# Patient Record
Sex: Female | Born: 1985 | Race: Asian | Hispanic: No | Marital: Single | Smoking: Never smoker
Health system: Southern US, Community
[De-identification: ages and names within clinical notes are randomized; demographics above are authoritative.]

## PROBLEM LIST (undated history)

## (undated) ENCOUNTER — Inpatient Hospital Stay (HOSPITAL_COMMUNITY): Payer: Self-pay

## (undated) DIAGNOSIS — R87629 Unspecified abnormal cytological findings in specimens from vagina: Secondary | ICD-10-CM

## (undated) HISTORY — PX: NO PAST SURGERIES: SHX2092

---

## 2006-11-22 ENCOUNTER — Ambulatory Visit (HOSPITAL_COMMUNITY): Admission: RE | Admit: 2006-11-22 | Discharge: 2006-11-22 | Payer: Self-pay | Admitting: Obstetrics & Gynecology

## 2007-03-01 ENCOUNTER — Inpatient Hospital Stay (HOSPITAL_COMMUNITY): Admission: AD | Admit: 2007-03-01 | Discharge: 2007-03-03 | Payer: Self-pay | Admitting: Obstetrics & Gynecology

## 2007-03-01 ENCOUNTER — Ambulatory Visit: Payer: Self-pay | Admitting: Family Medicine

## 2008-01-25 ENCOUNTER — Encounter (INDEPENDENT_AMBULATORY_CARE_PROVIDER_SITE_OTHER): Payer: Self-pay | Admitting: Obstetrics and Gynecology

## 2008-01-25 ENCOUNTER — Inpatient Hospital Stay (HOSPITAL_COMMUNITY): Admission: AD | Admit: 2008-01-25 | Discharge: 2008-01-27 | Payer: Self-pay | Admitting: Family Medicine

## 2008-07-08 IMAGING — US US OB COMP +14 WK
1 series · 1 of 1 positions shown · non-contrast
Comparison: none

OBSTETRICAL ULTRASOUND:

 This ultrasound exam was performed in the [HOSPITAL] Ultrasound Department.  The OB US report was generated in the AS system, and faxed to the ordering physician.  This report is also available in [REDACTED] PACS.

[Series 1: us ob comp +14 wk · 0.17mm/px · 1 of 1 slices shown]
[im 1/1]
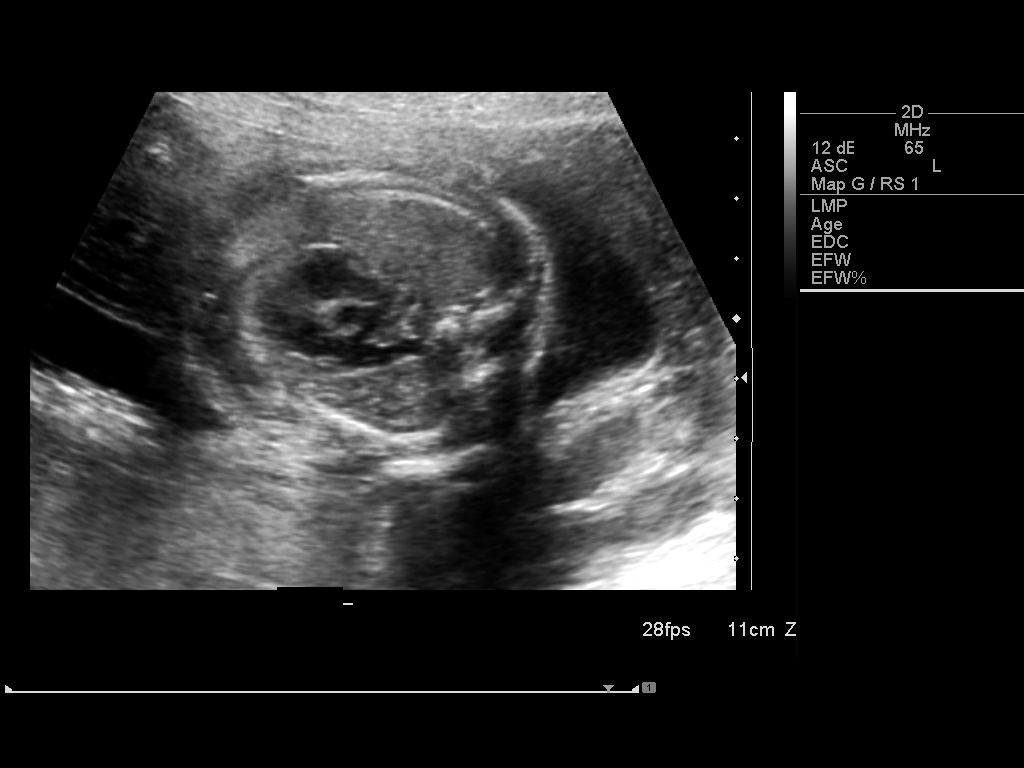

[1 of 1 positions shown; findings below may reference images not displayed]

IMPRESSION: See AS Obstetric US report.

## 2010-06-10 ENCOUNTER — Emergency Department (HOSPITAL_COMMUNITY): Admission: EM | Admit: 2010-06-10 | Discharge: 2010-06-11 | Payer: Self-pay | Admitting: Emergency Medicine

## 2010-12-30 LAB — POCT I-STAT, CHEM 8
BUN: 10 mg/dL (ref 6–23)
Calcium, Ion: 1.12 mmol/L (ref 1.12–1.32)
Chloride: 103 mEq/L (ref 96–112)
HCT: 44 % (ref 36.0–46.0)
Hemoglobin: 15 g/dL (ref 12.0–15.0)
Potassium: 3.6 mEq/L (ref 3.5–5.1)
Sodium: 139 mEq/L (ref 135–145)
TCO2: 26 mmol/L (ref 0–100)

## 2010-12-30 LAB — DIFFERENTIAL
Basophils Absolute: 0 10*3/uL (ref 0.0–0.1)
Basophils Relative: 0 % (ref 0–1)
Eosinophils Absolute: 0.1 10*3/uL (ref 0.0–0.7)
Eosinophils Relative: 1 % (ref 0–5)
Lymphocytes Relative: 13 % (ref 12–46)
Neutro Abs: 7.9 10*3/uL — ABNORMAL HIGH (ref 1.7–7.7)
Neutrophils Relative %: 82 % — ABNORMAL HIGH (ref 43–77)

## 2010-12-30 LAB — URINALYSIS, ROUTINE W REFLEX MICROSCOPIC
Bilirubin Urine: NEGATIVE
Hgb urine dipstick: NEGATIVE
Ketones, ur: NEGATIVE mg/dL
Specific Gravity, Urine: 1.023 (ref 1.005–1.030)

## 2010-12-30 LAB — URINE MICROSCOPIC-ADD ON

## 2010-12-30 LAB — CBC
HCT: 39.5 % (ref 36.0–46.0)
Hemoglobin: 13.4 g/dL (ref 12.0–15.0)
MCH: 26.2 pg (ref 26.0–34.0)
MCV: 77.3 fL — ABNORMAL LOW (ref 78.0–100.0)
RBC: 5.11 MIL/uL (ref 3.87–5.11)
RDW: 13.3 % (ref 11.5–15.5)

## 2011-07-11 LAB — CBC
HCT: 31.5 — ABNORMAL LOW
Hemoglobin: 11 — ABNORMAL LOW
Platelets: 148 — ABNORMAL LOW
RBC: 4.19
RBC: 4.47
RDW: 17.5 — ABNORMAL HIGH
WBC: 11.7 — ABNORMAL HIGH
WBC: 8.6

## 2013-07-08 LAB — OB RESULTS CONSOLE ABO/RH: RH TYPE: POSITIVE

## 2013-07-08 LAB — OB RESULTS CONSOLE RUBELLA ANTIBODY, IGM: Rubella: IMMUNE

## 2013-07-08 LAB — OB RESULTS CONSOLE RPR
RPR: NONREACTIVE
RPR: NONREACTIVE
RPR: NONREACTIVE
RPR: NONREACTIVE

## 2013-07-08 LAB — OB RESULTS CONSOLE HEPATITIS B SURFACE ANTIGEN: HEP B S AG: NEGATIVE

## 2013-07-08 LAB — OB RESULTS CONSOLE ANTIBODY SCREEN: Antibody Screen: NEGATIVE

## 2013-07-08 LAB — OB RESULTS CONSOLE HIV ANTIBODY (ROUTINE TESTING): HIV: NONREACTIVE

## 2013-10-16 NOTE — L&D Delivery Note (Signed)
Delivery Note At 7:57 AM a viable female was delivered via Vaginal, Spontaneous Delivery OA Presentation Apgars 9 9 weight pending  Placenta status:spontaneously with 3 vessel cord and intact , .  Cord:  with the following complications:none .  Cord pH: pending  Anesthesia: None  Episiotomy: none Lacerations: none Suture Repair: not applicable Est. Blood Loss (mL): 300  Mom to postpartum.  Baby to Couplet care / Skin to Skin.  Jeani HawkingMichelle L Thurma Priego 01/27/2014, 8:10 AM

## 2014-01-13 LAB — OB RESULTS CONSOLE GBS: GBS: POSITIVE

## 2014-01-23 ENCOUNTER — Encounter (HOSPITAL_COMMUNITY): Payer: Self-pay | Admitting: *Deleted

## 2014-01-23 ENCOUNTER — Inpatient Hospital Stay (HOSPITAL_COMMUNITY): Admission: RE | Admit: 2014-01-23 | Payer: Self-pay | Source: Ambulatory Visit

## 2014-01-27 ENCOUNTER — Inpatient Hospital Stay (HOSPITAL_COMMUNITY)
Admission: AD | Admit: 2014-01-27 | Discharge: 2014-01-29 | DRG: 775 | Disposition: A | Discharge status: Home / Self Care | Payer: Medicaid Other | Source: Ambulatory Visit | Attending: Obstetrics and Gynecology | Admitting: Obstetrics and Gynecology

## 2014-01-27 ENCOUNTER — Encounter (HOSPITAL_COMMUNITY): Payer: Self-pay

## 2014-01-27 DIAGNOSIS — Z22330 Carrier of Group B streptococcus: Secondary | ICD-10-CM

## 2014-01-27 DIAGNOSIS — O99892 Other specified diseases and conditions complicating childbirth: Principal | ICD-10-CM | POA: Diagnosis present

## 2014-01-27 DIAGNOSIS — O9989 Other specified diseases and conditions complicating pregnancy, childbirth and the puerperium: Principal | ICD-10-CM

## 2014-01-27 HISTORY — DX: Unspecified abnormal cytological findings in specimens from vagina: R87.629

## 2014-01-27 LAB — ABO/RH: ABO/RH(D): A POS

## 2014-01-27 LAB — TYPE AND SCREEN
ABO/RH(D): A POS
Antibody Screen: NEGATIVE

## 2014-01-27 LAB — CBC
HCT: 34.9 % — ABNORMAL LOW (ref 36.0–46.0)
Hemoglobin: 11.5 g/dL — ABNORMAL LOW (ref 12.0–15.0)
MCH: 24.1 pg — ABNORMAL LOW (ref 26.0–34.0)
MCHC: 33 g/dL (ref 30.0–36.0)
MCV: 73 fL — ABNORMAL LOW (ref 78.0–100.0)
Platelets: 150 K/uL (ref 150–400)
RBC: 4.78 MIL/uL (ref 3.87–5.11)
RDW: 15.7 % — ABNORMAL HIGH (ref 11.5–15.5)
WBC: 10.6 K/uL — ABNORMAL HIGH (ref 4.0–10.5)

## 2014-01-27 LAB — SYPHILIS: RPR W/REFLEX TO RPR TITER AND TREPONEMAL ANTIBODIES, TRADITIONAL SCREENING AND DIAGNOSIS ALGORITHM

## 2014-01-27 MED ORDER — MEDROXYPROGESTERONE ACETATE 150 MG/ML IM SUSP: 150.0000 mg | INTRAMUSCULAR | Status: DC | PRN

## 2014-01-27 MED ORDER — LACTATED RINGERS IV SOLN
INTRAVENOUS | Status: DC
  Administered 2014-01-27: 02:00:00 via INTRAVENOUS

## 2014-01-27 MED ORDER — ONDANSETRON HCL 4 MG/2ML IJ SOLN: 4.0000 mg | Freq: Four times a day (QID) | INTRAMUSCULAR | Status: DC | PRN

## 2014-01-27 MED ORDER — PRENATAL MULTIVITAMIN CH
1.0000 | ORAL_TABLET | Freq: Every day | ORAL | Status: DC
  Administered 2014-01-27 – 2014-01-28 (×2): 1 via ORAL
  Filled 2014-01-27 (×2): qty 1

## 2014-01-27 MED ORDER — OXYTOCIN 40 UNITS IN LACTATED RINGERS INFUSION - SIMPLE MED
62.5000 mL/h | INTRAVENOUS | Status: DC
  Filled 2014-01-27: qty 1000

## 2014-01-27 MED ORDER — FLEET ENEMA 7-19 GM/118ML RE ENEM: 1.0000 | ENEMA | Freq: Every day | RECTAL | Status: DC | PRN

## 2014-01-27 MED ORDER — LIDOCAINE HCL (PF) 1 % IJ SOLN
30.0000 mL | INTRAMUSCULAR | Status: DC | PRN
  Filled 2014-01-27: qty 30

## 2014-01-27 MED ORDER — BISACODYL 10 MG RE SUPP: 10.0000 mg | Freq: Every day | RECTAL | Status: DC | PRN

## 2014-01-27 MED ORDER — DIBUCAINE 1 % RE OINT: 1.0000 "application " | TOPICAL_OINTMENT | RECTAL | Status: DC | PRN

## 2014-01-27 MED ORDER — IBUPROFEN 600 MG PO TABS
600.0000 mg | ORAL_TABLET | Freq: Four times a day (QID) | ORAL | Status: DC
  Administered 2014-01-27 – 2014-01-29 (×8): 600 mg via ORAL
  Filled 2014-01-27 (×8): qty 1

## 2014-01-27 MED ORDER — OXYCODONE-ACETAMINOPHEN 5-325 MG PO TABS
1.0000 | ORAL_TABLET | ORAL | Status: DC | PRN
  Administered 2014-01-27: 1 via ORAL
  Filled 2014-01-27: qty 1

## 2014-01-27 MED ORDER — IBUPROFEN 600 MG PO TABS: 600.0000 mg | ORAL_TABLET | Freq: Four times a day (QID) | ORAL | Status: DC | PRN

## 2014-01-27 MED ORDER — ZOLPIDEM TARTRATE 5 MG PO TABS: 5.0000 mg | ORAL_TABLET | Freq: Every evening | ORAL | Status: DC | PRN

## 2014-01-27 MED ORDER — SODIUM CHLORIDE 0.9 % IV SOLN
2.0000 g | Freq: Once | INTRAVENOUS | Status: AC
  Administered 2014-01-27: 2 g via INTRAVENOUS
  Filled 2014-01-27: qty 2000

## 2014-01-27 MED ORDER — BENZOCAINE-MENTHOL 20-0.5 % EX AERO
1.0000 "application " | INHALATION_SPRAY | CUTANEOUS | Status: DC | PRN
  Administered 2014-01-27: 1 via TOPICAL
  Filled 2014-01-27: qty 56

## 2014-01-27 MED ORDER — FLEET ENEMA 7-19 GM/118ML RE ENEM: 1.0000 | ENEMA | RECTAL | Status: DC | PRN

## 2014-01-27 MED ORDER — ONDANSETRON HCL 4 MG/2ML IJ SOLN
4.0000 mg | INTRAMUSCULAR | Status: DC | PRN
Start: 2014-01-27 — End: 2014-01-29

## 2014-01-27 MED ORDER — SIMETHICONE 80 MG PO CHEW: 80.0000 mg | CHEWABLE_TABLET | ORAL | Status: DC | PRN

## 2014-01-27 MED ORDER — LANOLIN HYDROUS EX OINT: TOPICAL_OINTMENT | CUTANEOUS | Status: DC | PRN

## 2014-01-27 MED ORDER — MEASLES, MUMPS & RUBELLA VAC ~~LOC~~ INJ
0.5000 mL | INJECTION | Freq: Once | SUBCUTANEOUS | Status: DC
  Filled 2014-01-27: qty 0.5

## 2014-01-27 MED ORDER — LACTATED RINGERS IV SOLN: 500.0000 mL | INTRAVENOUS | Status: DC | PRN

## 2014-01-27 MED ORDER — CITRIC ACID-SODIUM CITRATE 334-500 MG/5ML PO SOLN: 30.0000 mL | ORAL | Status: DC | PRN

## 2014-01-27 MED ORDER — DIPHENHYDRAMINE HCL 25 MG PO CAPS: 25.0000 mg | ORAL_CAPSULE | Freq: Four times a day (QID) | ORAL | Status: DC | PRN

## 2014-01-27 MED ORDER — ONDANSETRON HCL 4 MG PO TABS: 4.0000 mg | ORAL_TABLET | ORAL | Status: DC | PRN

## 2014-01-27 MED ORDER — ACETAMINOPHEN 325 MG PO TABS: 650.0000 mg | ORAL_TABLET | ORAL | Status: DC | PRN

## 2014-01-27 MED ORDER — TETANUS-DIPHTH-ACELL PERTUSSIS 5-2.5-18.5 LF-MCG/0.5 IM SUSP: 0.5000 mL | Freq: Once | INTRAMUSCULAR | Status: DC

## 2014-01-27 MED ORDER — OXYTOCIN BOLUS FROM INFUSION: 500.0000 mL | INTRAVENOUS | Status: DC

## 2014-01-27 MED ORDER — SENNOSIDES-DOCUSATE SODIUM 8.6-50 MG PO TABS
2.0000 | ORAL_TABLET | ORAL | Status: DC
  Administered 2014-01-28 – 2014-01-29 (×2): 2 via ORAL
  Filled 2014-01-27 (×2): qty 2

## 2014-01-27 MED ORDER — BUTORPHANOL TARTRATE 1 MG/ML IJ SOLN
1.0000 mg | Freq: Once | INTRAMUSCULAR | Status: AC
  Administered 2014-01-27: 1 mg via INTRAVENOUS
  Filled 2014-01-27: qty 1

## 2014-01-27 MED ORDER — SODIUM CHLORIDE 0.9 % IV SOLN
1.0000 g | INTRAVENOUS | Status: DC
  Administered 2014-01-27: 1 g via INTRAVENOUS
  Filled 2014-01-27 (×3): qty 1000

## 2014-01-27 MED ORDER — WITCH HAZEL-GLYCERIN EX PADS: 1.0000 "application " | MEDICATED_PAD | CUTANEOUS | Status: DC | PRN

## 2014-01-27 MED ORDER — OXYCODONE-ACETAMINOPHEN 5-325 MG PO TABS: 1.0000 | ORAL_TABLET | ORAL | Status: DC | PRN

## 2014-01-27 NOTE — H&P (Signed)
Wendy Alexander is a 28 y.o. female presenting for labor. Maternal Medical History:  Reason for admission: Contractions.   Contractions: Frequency: regular.   Perceived severity is moderate.    Fetal activity: Perceived fetal activity is normal.    Prenatal complications: no prenatal complications   OB History   Grav Para Term Preterm Abortions TAB SAB Ect Mult Living   3 2 2       2      Past Medical History  Diagnosis Date  . Vaginal Pap smear, abnormal     last pap 03/21/13 WNL   Past Surgical History  Procedure Laterality Date  . No past surgeries     Family History: family history is not on file. Social History:  reports that she has never smoked. She does not have any smokeless tobacco history on file. She reports that she does not drink alcohol or use illicit drugs.   Prenatal Transfer Tool  Maternal Diabetes: No Genetic Screening: Normal Maternal Ultrasounds/Referrals: Normal Fetal Ultrasounds or other Referrals:  None Maternal Substance Abuse:  No Significant Maternal Medications:  None Significant Maternal Lab Results:  None Other Comments:  None  Review of Systems  All other systems reviewed and are negative.   Dilation: Lip/rim Effacement (%): 100 Station: +1 Exam by:: Dr. Vincente PoliGrewal Blood pressure 107/52, pulse 68, temperature 97.8 F (36.6 C), temperature source Oral, resp. rate 18, height 5\' 3"  (1.6 m), weight 74.844 kg (165 lb), last menstrual period 04/24/2013. Maternal Exam:  Uterine Assessment: Contraction strength is moderate.  Contraction frequency is regular.   Abdomen: Fetal presentation: vertex     Fetal Exam Fetal State Assessment: Category I - tracings are normal.     Physical Exam  Nursing note and vitals reviewed. Constitutional: She appears well-developed.  HENT:  Head: Normocephalic.  Eyes: Pupils are equal, round, and reactive to light.  Neck: Normal range of motion.  Respiratory: Effort normal.  GI: Soft.    Prenatal  labs: ABO, Rh: --/--/A POS, A POS (04/14 0139) Antibody: NEG (04/14 0139) Rubella: Immune (09/23 0000) RPR: Nonreactive, Nonreactive, Nonreactive, Nonreactive (09/23 0000)  HBsAg: Negative (09/23 0000)  HIV: Non-reactive (09/23 0000)  GBS: Positive (03/31 0000)   Assessment/Plan: IUP at term Labor Anticipate nsvd  [name] 01/27/2014, 8:07 AM

## 2014-01-27 NOTE — MAU Note (Signed)
Pt G3 P2 at 39.5wks having contractions since 1800.

## 2014-01-27 NOTE — Lactation Note (Addendum)
This note was copied from the chart of Wendy Zakia Solazzo. Lactation Consultation Note  Patient Name: Wendy Alexander WUJWJ'XToday's Date: 01/27/2014 Reason for consult: Initial assessment Baby skin to skin after bath . Per mom baby was bathed around 5 pm.  Lc assessed breast tissue and noted semi erect , semi compressible areolas. LC showed mom how to hand express, areolas softened ,several drops form the right compared to the left.  Baby sleepy, awake for short interval. LC encouraged mom to continue skin to skin .  Per not active with WIC, but has the information.  Due to semi erect nipples , semi compressible areolas , shells and pre-pumping are indicated. Instructed mom  On the use and showed her how to use the shells and hand pump. Mom aware to call when the baby is showing feeding cues.   Maternal Data Formula Feeding for Exclusion: No Infant to breast within first hour of birth: Yes Has patient been taught Hand Expression?: Yes (more from the right compared to the left ) Does the patient have breastfeeding experience prior to this delivery?: Yes  Feeding Feeding Type: Breast Fed Length of feed: 15 min  LATCH Score/Interventions Latch: Too sleepy or reluctant, no latch achieved, no sucking elicited. Intervention(s): Skin to skin;Waking techniques Intervention(s): Adjust position;Breast massage;Breast compression;Assist with latch  Audible Swallowing: None  Type of Nipple: Flat  Comfort (Breast/Nipple): Soft / non-tender     Hold (Positioning): Assistance needed to correctly position infant at breast and maintain latch. Intervention(s): Breastfeeding basics reviewed;Support Pillows;Position options;Skin to skin  LATCH Score: 4  Lactation Tools Discussed/Used WIC Program: No (per mom )   Consult Status Consult Status: Follow-up Date: 01/27/14 Follow-up type: In-patient    Wendy Alexander 01/27/2014, 6:02 PM

## 2014-01-28 LAB — CBC
HCT: 30.2 % — ABNORMAL LOW (ref 36.0–46.0)
Hemoglobin: 9.9 g/dL — ABNORMAL LOW (ref 12.0–15.0)
MCH: 24.1 pg — AB (ref 26.0–34.0)
MCHC: 32.8 g/dL (ref 30.0–36.0)
MCV: 73.7 fL — ABNORMAL LOW (ref 78.0–100.0)
PLATELETS: 144 10*3/uL — AB (ref 150–400)
RBC: 4.1 MIL/uL (ref 3.87–5.11)
RDW: 15.7 % — AB (ref 11.5–15.5)
WBC: 10 10*3/uL (ref 4.0–10.5)

## 2014-01-28 NOTE — Lactation Note (Signed)
This note was copied from the chart of Wendy Sarely Ohlendorf. Lactation Consultation Note Follow up consult:  Mother speaks [place].  Interpreter present. Mother denies soreness or problems. Encouraged mother to breastfeed 8-12 times for more than 10 min.   Suggested mother call to view next feeding.   Patient Name: Wendy Alexander RUEAV'WToday's Date: 01/28/2014 Reason for consult: Follow-up assessment   Maternal Data    Feeding Feeding Type: Breast Fed Length of feed: 20 min  LATCH Score/Interventions                      Lactation Tools Discussed/Used     Consult Status Consult Status: Follow-up Date: 01/29/14 Follow-up type: In-patient    Dulce SellarRuth Boschen Berkelhammer 01/28/2014, 8:53 AM

## 2014-01-28 NOTE — Progress Notes (Signed)
Post Partum Day 1 Subjective: no complaints, up ad lib, voiding, tolerating PO and patient has interpreter , denies Ha or RUQ pain  Objective: Blood pressure 110/85, pulse 90, temperature 98.3 F (36.8 C), temperature source Oral, resp. rate 18, height 5\' 3"  (1.6 m), weight 165 lb (74.844 kg), last menstrual period 04/24/2013, SpO2 99.00%, unknown if currently breastfeeding.  Physical Exam:  General: alert and cooperative Lochia: appropriate Uterine Fundus: firm Incision: perineum intact DVT Evaluation: No evidence of DVT seen on physical exam. Negative Homan's sign. No cords or calf tenderness. No significant calf/ankle edema. DTR's 1+   Recent Labs  01/27/14 0139 01/28/14 0355  HGB 11.5* 9.9*  HCT 34.9* 30.2*    Assessment/Plan: Plan for discharge tomorrow  CMP and CBC in am   LOS: 1 day   Judith BlonderCarol G Tayla Panozzo 01/28/2014, 8:01 AM

## 2014-01-28 NOTE — Progress Notes (Signed)
Ur chart review completed.  

## 2014-01-29 ENCOUNTER — Ambulatory Visit: Payer: Self-pay

## 2014-01-29 LAB — COMPREHENSIVE METABOLIC PANEL
ALT: 12 U/L (ref 0–35)
AST: 17 U/L (ref 0–37)
Albumin: 2.1 g/dL — ABNORMAL LOW (ref 3.5–5.2)
Alkaline Phosphatase: 149 U/L — ABNORMAL HIGH (ref 39–117)
BILIRUBIN TOTAL: 0.3 mg/dL (ref 0.3–1.2)
BUN: 12 mg/dL (ref 6–23)
CHLORIDE: 102 meq/L (ref 96–112)
CO2: 26 meq/L (ref 19–32)
Calcium: 8.4 mg/dL (ref 8.4–10.5)
Creatinine, Ser: 0.53 mg/dL (ref 0.50–1.10)
GFR calc Af Amer: >90 mL/min (ref >90)
Glucose, Bld: 61 mg/dL — ABNORMAL LOW (ref 70–99)
Potassium: 4.2 mEq/L (ref 3.7–5.3)
Sodium: 139 mEq/L (ref 137–147)
Total Protein: 5.7 g/dL — ABNORMAL LOW (ref 6.0–8.3)

## 2014-01-29 LAB — CBC
HCT: 31.7 % — ABNORMAL LOW (ref 36.0–46.0)
Hemoglobin: 10.3 g/dL — ABNORMAL LOW (ref 12.0–15.0)
MCH: 23.8 pg — ABNORMAL LOW (ref 26.0–34.0)
MCHC: 32.5 g/dL (ref 30.0–36.0)
MCV: 73.4 fL — ABNORMAL LOW (ref 78.0–100.0)
PLATELETS: 140 10*3/uL — AB (ref 150–400)
RBC: 4.32 MIL/uL (ref 3.87–5.11)
RDW: 15.9 % — ABNORMAL HIGH (ref 11.5–15.5)
WBC: 6.9 10*3/uL (ref 4.0–10.5)

## 2014-01-29 MED ORDER — PRENATAL MULTIVITAMIN CH: 1.0000 | ORAL_TABLET | Freq: Every day | ORAL | Status: DC

## 2014-01-29 MED ORDER — IBUPROFEN 600 MG PO TABS: 600.0000 mg | ORAL_TABLET | Freq: Four times a day (QID) | ORAL | Status: DC

## 2014-01-29 NOTE — Discharge Summary (Signed)
Obstetric Discharge Summary Reason for Admission: onset of labor Prenatal Procedures: ultrasound Intrapartum Procedures: spontaneous vaginal delivery Postpartum Procedures: none Complications-Operative and Postpartum: none Hemoglobin  Date Value Ref Range Status  01/29/2014 10.3* 12.0 - 15.0 g/dL Final     HCT  Date Value Ref Range Status  01/29/2014 31.7* 36.0 - 46.0 % Final    Physical Exam:  General: alert and cooperative Lochia: appropriate Uterine Fundus: firm Incision: perineum intact DVT Evaluation: No evidence of DVT seen on physical exam. Negative Homan's sign. No cords or calf tenderness. No significant calf/ankle edema. DTR's 1+  Discharge Diagnoses: Term Pregnancy-delivered  Discharge Information: Date: 01/29/2014 Activity: pelvic rest Diet: routine Medications: PNV and Ibuprofen Condition: stable Instructions: refer to practice specific booklet Discharge to: home   Newborn Data: Live born female  Birth Weight: 7 lb 1.4 oz (3215 g) APGAR: 9, 9  Home with mother.  Wendy Alexander 01/29/2014, 8:34 AM

## 2014-01-29 NOTE — Lactation Note (Signed)
This note was copied from the chart of Wendy Ayomide Dente. Lactation Consultation Note  Patient Name: Wendy Alexander AVWUJ'WToday's Date: 01/29/2014 Reason for consult: Follow-up assessment;Breast/nipple pain Mom had baby latched to right breast when LC arrived, demonstrating a good rhythmic suck with few swallows noted. Nipples have short nipple shaft on bottom half of nipple, some compression noted on upper half of nipple, Mom denies pain on the right breast. Assisted Mom with positioning and obtaining good depth with latching baby on left breast. Mom reports tenderness on left nipple, positional stripe present. [facility], Mom reported less discomfort with baby at the breast. Some compression noted when baby came off the breast due to shape of nipple. Baby had well flanged lips, demonstrated a good rhythmic suck, swallows noted. Mom had pumped approx 3 ml of colostrum. Demonstrated to Mom and had her demonstrate back how to supplement this EBM using foley cup. Encouraged Mom to pre-pump to help with latch. Care for sore nipples reviewed, comfort gels given with instructions. Engorgement care reviewed if needed. Advised to monitor voids/stools, referred to page 24 of Baby N Me booklet. Advised baby should be at the breast 8-12 times in 24 hours.  Advised of OP services and support group. Mom declined interpreter at this visit and was able to demonstrate teach back with education at this visit.   Maternal Data    Feeding Feeding Type: Breast Milk Length of feed: 25 min  LATCH Score/Interventions Latch: Repeated attempts needed to sustain latch, nipple held in mouth throughout feeding, stimulation needed to elicit sucking reflex. Intervention(s): Skin to skin;Waking techniques;Teach feeding cues Intervention(s): Adjust position;Assist with latch;Breast massage;Breast compression  Audible Swallowing: A few with stimulation Intervention(s): Skin to skin;Hand expression  Type of Nipple:  Everted at rest and after stimulation (short nipple shaft bottom half of nipple) Intervention(s): Hand pump  Comfort (Breast/Nipple): Filling, red/small blisters or bruises, mild/mod discomfort  Problem noted: Mild/Moderate discomfort;Cracked, bleeding, blisters, bruises Interventions  (Cracked/bleeding/bruising/blister): Hand pump;Expressed breast milk to nipple Interventions (Mild/moderate discomfort): Comfort gels  Hold (Positioning): Assistance needed to correctly position infant at breast and maintain latch. Intervention(s): Breastfeeding basics reviewed;Support Pillows;Position options;Skin to skin  LATCH Score: 6  Lactation Tools Discussed/Used Tools: Pump;Comfort gels Breast pump type: Manual   Consult Status Consult Status: Complete Date: 01/29/14 Follow-up type: In-patient    Kearney HardKathy Ann Joci Dress 01/29/2014, 11:25 AM

## 2014-08-17 ENCOUNTER — Encounter (HOSPITAL_COMMUNITY): Payer: Self-pay

## 2015-03-17 ENCOUNTER — Inpatient Hospital Stay (HOSPITAL_COMMUNITY)
Admission: AD | Admit: 2015-03-17 | Discharge: 2015-03-17 | Disposition: A | Discharge status: Home / Self Care | Payer: Medicaid Other | Source: Ambulatory Visit | Attending: Internal Medicine | Admitting: Internal Medicine

## 2015-03-17 ENCOUNTER — Encounter (HOSPITAL_COMMUNITY): Payer: Self-pay

## 2015-03-17 DIAGNOSIS — N912 Amenorrhea, unspecified: Secondary | ICD-10-CM | POA: Insufficient documentation

## 2015-03-17 DIAGNOSIS — N926 Irregular menstruation, unspecified: Secondary | ICD-10-CM

## 2015-03-17 DIAGNOSIS — Z3200 Encounter for pregnancy test, result unknown: Secondary | ICD-10-CM | POA: Diagnosis present

## 2015-03-17 DIAGNOSIS — Z3201 Encounter for pregnancy test, result positive: Secondary | ICD-10-CM | POA: Diagnosis not present

## 2015-03-17 LAB — POCT PREGNANCY, URINE: Preg Test, Ur: POSITIVE — AB

## 2015-03-17 NOTE — MAU Provider Note (Signed)
Subjective:  [name] is a 29 y.o. female [phone] at 5470w0d who present for pregnancy test only. She currently denies pain or bleeding.   Objective:  GENERAL: Well-developed, well-nourished female in no acute distress.  LUNGS: Effort normal SKIN: Warm, dry and without erythema PSYCH: Normal mood and affect  Filed Vitals:   03/17/15 1035  BP: 128/82  Pulse: 82  Temp: 98.6 F (37 C)  Resp: 18   Results for orders placed or performed during the hospital encounter of 03/17/15 (from the past 48 hour(s))  Pregnancy, urine POC     Status: Abnormal   Collection Time: 03/17/15 10:43 AM  Result Value Ref Range   Preg Test, Ur POSITIVE (A) NEGATIVE    Comment:        THE SENSITIVITY OF THIS METHODOLOGY IS >24 mIU/mL     Assessment:  1. Missed period     Plan:  Discharge home in stable condition First trimester warning signs discussed Start prenatal care ASAP A list of providers given to patient Pregnancy verification letter given  Wendy LopeJennifer I Jarelis Ehlert, NP 03/17/2015 11:54 AM

## 2015-03-17 NOTE — MAU Note (Signed)
Pt presents for confirmation of pregnancy. Denies pain or bleeding.

## 2015-03-17 NOTE — Discharge Instructions (Signed)
First Trimester of Pregnancy The first trimester of pregnancy is from week 1 until the end of week 12 (months 1 through 3). A week after a sperm fertilizes an egg, the egg will implant on the wall of the uterus. This embryo will begin to develop into a baby. Genes from you and your partner are forming the baby. The female genes determine whether the baby is a boy or a girl. At 6-8 weeks, the eyes and face are formed, and the heartbeat can be seen on ultrasound. At the end of 12 weeks, all the baby's organs are formed.  Now that you are pregnant, you will want to do everything you can to have a healthy baby. Two of the most important things are to get good prenatal care and to follow your health care provider's instructions. Prenatal care is all the medical care you receive before the baby's birth. This care will help prevent, find, and treat any problems during the pregnancy and childbirth. BODY CHANGES Your body goes through many changes during pregnancy. The changes vary from woman to woman.   You may gain or lose a couple of pounds at first.  You may feel sick to your stomach (nauseous) and throw up (vomit). If the vomiting is uncontrollable, call your health care provider.  You may tire easily.  You may develop headaches that can be relieved by medicines approved by your health care provider.  You may urinate more often. Painful urination may mean you have a bladder infection.  You may develop heartburn as a result of your pregnancy.  You may develop constipation because certain hormones are causing the muscles that push waste through your intestines to slow down.  You may develop hemorrhoids or swollen, bulging veins (varicose veins).  Your breasts may begin to grow larger and become tender. Your nipples may stick out more, and the tissue that surrounds them (areola) may become darker.  Your gums may bleed and may be sensitive to brushing and flossing.  Dark spots or blotches (chloasma,  mask of pregnancy) may develop on your face. This will likely fade after the baby is born.  Your menstrual periods will stop.  You may have a loss of appetite.  You may develop cravings for certain kinds of food.  You may have changes in your emotions from day to day, such as being excited to be pregnant or being concerned that something may go wrong with the pregnancy and baby.  You may have more vivid and strange dreams.  You may have changes in your hair. These can include thickening of your hair, rapid growth, and changes in texture. Some women also have hair loss during or after pregnancy, or hair that feels dry or thin. Your hair will most likely return to normal after your baby is born. WHAT TO EXPECT AT YOUR PRENATAL VISITS During a routine prenatal visit:  You will be weighed to make sure you and the baby are growing normally.  Your blood pressure will be taken.  Your abdomen will be measured to track your baby's growth.  The fetal heartbeat will be listened to starting around week 10 or 12 of your pregnancy.  Test results from any previous visits will be discussed. Your health care provider may ask you:  How you are feeling.  If you are feeling the baby move.  If you have had any abnormal symptoms, such as leaking fluid, bleeding, severe headaches, or abdominal cramping.  If you have any questions. Other tests   that may be performed during your first trimester include:  Blood tests to find your blood type and to check for the presence of any previous infections. They will also be used to check for low iron levels (anemia) and Rh antibodies. Later in the pregnancy, blood tests for diabetes will be done along with other tests if problems develop.  Urine tests to check for infections, diabetes, or protein in the urine.  An ultrasound to confirm the proper growth and development of the baby.  An amniocentesis to check for possible genetic problems.  Fetal screens for  spina bifida and Down syndrome.  You may need other tests to make sure you and the baby are doing well. HOME CARE INSTRUCTIONS  Medicines  Follow your health care provider's instructions regarding medicine use. Specific medicines may be either safe or unsafe to take during pregnancy.  Take your prenatal vitamins as directed.  If you develop constipation, try taking a stool softener if your health care provider approves. Diet  Eat regular, well-balanced meals. Choose a variety of foods, such as meat or vegetable-based protein, fish, milk and low-fat dairy products, vegetables, fruits, and whole grain breads and cereals. Your health care provider will help you determine the amount of weight gain that is right for you.  Avoid raw meat and uncooked cheese. These carry germs that can cause birth defects in the baby.  Eating four or five small meals rather than three large meals a day may help relieve nausea and vomiting. If you start to feel nauseous, eating a few soda crackers can be helpful. Drinking liquids between meals instead of during meals also seems to help nausea and vomiting.  If you develop constipation, eat more high-fiber foods, such as fresh vegetables or fruit and whole grains. Drink enough fluids to keep your urine clear or pale yellow. Activity and Exercise  Exercise only as directed by your health care provider. Exercising will help you:  Control your weight.  Stay in shape.  Be prepared for labor and delivery.  Experiencing pain or cramping in the lower abdomen or low back is a good sign that you should stop exercising. Check with your health care provider before continuing normal exercises.  Try to avoid standing for long periods of time. Move your legs often if you must stand in one place for a long time.  Avoid heavy lifting.  Wear low-heeled shoes, and practice good posture.  You may continue to have sex unless your health care provider directs you  otherwise. Relief of Pain or Discomfort  Wear a good support bra for breast tenderness.   Take warm sitz baths to soothe any pain or discomfort caused by hemorrhoids. Use hemorrhoid cream if your health care provider approves.   Rest with your legs elevated if you have leg cramps or low back pain.  If you develop varicose veins in your legs, wear support hose. Elevate your feet for 15 minutes, 3-4 times a day. Limit salt in your diet. Prenatal Care  Schedule your prenatal visits by the twelfth week of pregnancy. They are usually scheduled monthly at first, then more often in the last 2 months before delivery.  Write down your questions. Take them to your prenatal visits.  Keep all your prenatal visits as directed by your health care provider. Safety  Wear your seat belt at all times when driving.  Make a list of emergency phone numbers, including numbers for family, friends, the hospital, and police and fire departments. General Tips    Ask your health care provider for a referral to a local prenatal education class. Begin classes no later than at the beginning of month 6 of your pregnancy.  Ask for help if you have counseling or nutritional needs during pregnancy. Your health care provider can offer advice or refer you to specialists for help with various needs.  Do not use hot tubs, steam rooms, or saunas.  Do not douche or use tampons or scented sanitary pads.  Do not cross your legs for long periods of time.  Avoid cat litter boxes and soil used by cats. These carry germs that can cause birth defects in the baby and possibly loss of the fetus by miscarriage or stillbirth.  Avoid all smoking, herbs, alcohol, and medicines not prescribed by your health care provider. Chemicals in these affect the formation and growth of the baby.  Schedule a dentist appointment. At home, brush your teeth with a soft toothbrush and be gentle when you floss. SEEK MEDICAL CARE IF:   You have  dizziness.  You have mild pelvic cramps, pelvic pressure, or nagging pain in the abdominal area.  You have persistent nausea, vomiting, or diarrhea.  You have a bad smelling vaginal discharge.  You have pain with urination.  You notice increased swelling in your face, hands, legs, or ankles. SEEK IMMEDIATE MEDICAL CARE IF:   You have a fever.  You are leaking fluid from your vagina.  You have spotting or bleeding from your vagina.  You have severe abdominal cramping or pain.  You have rapid weight gain or loss.  You vomit blood or material that looks like coffee grounds.  You are exposed to German measles and have never had them.  You are exposed to fifth disease or chickenpox.  You develop a severe headache.  You have shortness of breath.  You have any kind of trauma, such as from a fall or a car accident. Document Released: 09/26/2001 Document Revised: 02/16/2014 Document Reviewed: 08/12/2013 Exit[employer] Patient Information 2015 [employer]. This information is not intended to replace advice given to you by your health care provider. Make sure you discuss any questions you have with your health care provider.  

## 2015-05-11 LAB — OB RESULTS CONSOLE RPR: RPR: NONREACTIVE

## 2015-05-11 LAB — OB RESULTS CONSOLE HIV ANTIBODY (ROUTINE TESTING): HIV: NONREACTIVE

## 2015-05-11 LAB — OB RESULTS CONSOLE ABO/RH: RH Type: POSITIVE

## 2015-05-11 LAB — OB RESULTS CONSOLE HEPATITIS B SURFACE ANTIGEN: HEP B S AG: NEGATIVE

## 2015-05-11 LAB — OB RESULTS CONSOLE GC/CHLAMYDIA
Chlamydia: NEGATIVE
Gonorrhea: NEGATIVE

## 2015-05-11 LAB — OB RESULTS CONSOLE RUBELLA ANTIBODY, IGM: Rubella: IMMUNE

## 2015-05-11 LAB — OB RESULTS CONSOLE ANTIBODY SCREEN: Antibody Screen: NEGATIVE

## 2015-09-26 LAB — OB RESULTS CONSOLE GBS: STREP GROUP B AG: POSITIVE

## 2015-10-01 ENCOUNTER — Encounter (HOSPITAL_COMMUNITY): Payer: Self-pay | Admitting: *Deleted

## 2015-10-01 ENCOUNTER — Telehealth (HOSPITAL_COMMUNITY): Payer: Self-pay | Admitting: *Deleted

## 2015-10-01 NOTE — Telephone Encounter (Signed)
Preadmission screen  

## 2015-10-06 ENCOUNTER — Inpatient Hospital Stay (HOSPITAL_COMMUNITY): Admission: RE | Admit: 2015-10-06 | Payer: Medicaid Other | Source: Ambulatory Visit

## 2015-10-06 NOTE — H&P (Signed)
Wendy Alexander is a 29 y.o. female G4P3 @ 3839 wks presenting for elective IOL.  Pt with h/o rapid labor/delivery - last delivery in her car.  History OB History    Gravida Para Term Preterm AB TAB SAB Ectopic Multiple Living   4 3 3       3      Past Medical History  Diagnosis Date  . Vaginal Pap smear, abnormal     last pap 03/21/13 WNL   Past Surgical History  Procedure Laterality Date  . No past surgeries     Family History: family history includes Cancer in her brother. Social History:  reports that she has never smoked. She has never used smokeless tobacco. She reports that she does not drink alcohol or use illicit drugs.   Prenatal Transfer Tool  Maternal Diabetes: No Genetic Screening: Normal Maternal Ultrasounds/Referrals: Normal Fetal Ultrasounds or other Referrals:  None Maternal Substance Abuse:  No Significant Maternal Medications:  None Significant Maternal Lab Results:  None Other Comments:  None  ROS    Last menstrual period 01/06/2015, unknown if currently breastfeeding. Exam Physical Exam  Gen - NAD Abd - gravid, NT  EFW 6.5# Ext - NT Cvx 3/90/-2 Prenatal labs: ABO, Rh: A/Positive/-- (07/26 0000) Antibody: Negative (07/26 0000) Rubella: Immune (07/26 0000) RPR: Nonreactive (07/26 0000)  HBsAg: Negative (07/26 0000)  HIV: Non-reactive (07/26 0000)  GBS: Positive (12/11 0000)   Assessment/Plan: Admit PCN Pitocin/AROM   Wendy Alexander 10/06/2015, 2:18 PM

## 2015-10-07 ENCOUNTER — Encounter (HOSPITAL_COMMUNITY): Payer: Self-pay

## 2015-10-07 ENCOUNTER — Inpatient Hospital Stay (HOSPITAL_COMMUNITY)
Admission: RE | Admit: 2015-10-07 | Discharge: 2015-10-08 | DRG: 775 | Disposition: A | Discharge status: Home / Self Care | Payer: Medicaid Other | Source: Ambulatory Visit | Attending: Obstetrics and Gynecology | Admitting: Obstetrics and Gynecology

## 2015-10-07 DIAGNOSIS — O99824 Streptococcus B carrier state complicating childbirth: Secondary | ICD-10-CM | POA: Diagnosis present

## 2015-10-07 DIAGNOSIS — O26893 Other specified pregnancy related conditions, third trimester: Secondary | ICD-10-CM | POA: Diagnosis present

## 2015-10-07 DIAGNOSIS — Z3A39 39 weeks gestation of pregnancy: Secondary | ICD-10-CM

## 2015-10-07 DIAGNOSIS — Z3490 Encounter for supervision of normal pregnancy, unspecified, unspecified trimester: Secondary | ICD-10-CM

## 2015-10-07 LAB — CBC
HCT: 33.7 % — ABNORMAL LOW (ref 36.0–46.0)
HEMOGLOBIN: 11.1 g/dL — AB (ref 12.0–15.0)
MCH: 24.6 pg — AB (ref 26.0–34.0)
MCHC: 32.9 g/dL (ref 30.0–36.0)
MCV: 74.6 fL — ABNORMAL LOW (ref 78.0–100.0)
Platelets: 149 10*3/uL — ABNORMAL LOW (ref 150–400)
RBC: 4.52 MIL/uL (ref 3.87–5.11)
RDW: 15 % (ref 11.5–15.5)
WBC: 8.4 10*3/uL (ref 4.0–10.5)

## 2015-10-07 LAB — TYPE AND SCREEN
ABO/RH(D): A POS
Antibody Screen: NEGATIVE

## 2015-10-07 LAB — RPR: RPR Ser Ql: NONREACTIVE

## 2015-10-07 MED ORDER — LIDOCAINE HCL (PF) 1 % IJ SOLN: 30.0000 mL | INTRAMUSCULAR | Status: DC | PRN

## 2015-10-07 MED ORDER — PENICILLIN G POTASSIUM 5000000 UNITS IJ SOLR
2.5000 10*6.[IU] | INTRAVENOUS | Status: DC
  Filled 2015-10-07 (×5): qty 2.5

## 2015-10-07 MED ORDER — OXYCODONE-ACETAMINOPHEN 5-325 MG PO TABS
2.0000 | ORAL_TABLET | ORAL | Status: DC | PRN
Start: 2015-10-07 — End: 2015-10-07
  Administered 2015-10-07: 2 via ORAL

## 2015-10-07 MED ORDER — MEDROXYPROGESTERONE ACETATE 150 MG/ML IM SUSP: 150.0000 mg | INTRAMUSCULAR | Status: DC | PRN

## 2015-10-07 MED ORDER — SIMETHICONE 80 MG PO CHEW: 80.0000 mg | CHEWABLE_TABLET | ORAL | Status: DC | PRN

## 2015-10-07 MED ORDER — PRENATAL MULTIVITAMIN CH
1.0000 | ORAL_TABLET | Freq: Every day | ORAL | Status: DC
  Administered 2015-10-08: 1 via ORAL
  Filled 2015-10-07: qty 1

## 2015-10-07 MED ORDER — LACTATED RINGERS IV SOLN
INTRAVENOUS | Status: DC
  Administered 2015-10-07: 09:00:00 via INTRAVENOUS

## 2015-10-07 MED ORDER — PENICILLIN G POTASSIUM 5000000 UNITS IJ SOLR
5.0000 10*6.[IU] | Freq: Once | INTRAVENOUS | Status: AC
  Administered 2015-10-07: 5 10*6.[IU] via INTRAVENOUS
  Filled 2015-10-07: qty 5

## 2015-10-07 MED ORDER — ONDANSETRON HCL 4 MG/2ML IJ SOLN: 4.0000 mg | Freq: Four times a day (QID) | INTRAMUSCULAR | Status: DC | PRN

## 2015-10-07 MED ORDER — TERBUTALINE SULFATE 1 MG/ML IJ SOLN: 0.2500 mg | Freq: Once | INTRAMUSCULAR | Status: DC | PRN

## 2015-10-07 MED ORDER — TETANUS-DIPHTH-ACELL PERTUSSIS 5-2.5-18.5 LF-MCG/0.5 IM SUSP
0.5000 mL | Freq: Once | INTRAMUSCULAR | Status: AC
  Administered 2015-10-08: 0.5 mL via INTRAMUSCULAR
  Filled 2015-10-07 (×2): qty 0.5

## 2015-10-07 MED ORDER — LACTATED RINGERS IV SOLN: 500.0000 mL | INTRAVENOUS | Status: DC | PRN

## 2015-10-07 MED ORDER — OXYCODONE-ACETAMINOPHEN 5-325 MG PO TABS: 1.0000 | ORAL_TABLET | ORAL | Status: DC | PRN

## 2015-10-07 MED ORDER — MEASLES, MUMPS & RUBELLA VAC ~~LOC~~ INJ: 0.5000 mL | INJECTION | Freq: Once | SUBCUTANEOUS | Status: DC

## 2015-10-07 MED ORDER — ACETAMINOPHEN 325 MG PO TABS: 650.0000 mg | ORAL_TABLET | ORAL | Status: DC | PRN

## 2015-10-07 MED ORDER — ONDANSETRON HCL 4 MG/2ML IJ SOLN: 4.0000 mg | INTRAMUSCULAR | Status: DC | PRN

## 2015-10-07 MED ORDER — WITCH HAZEL-GLYCERIN EX PADS: 1.0000 "application " | MEDICATED_PAD | CUTANEOUS | Status: DC | PRN

## 2015-10-07 MED ORDER — OXYTOCIN 40 UNITS IN LACTATED RINGERS INFUSION - SIMPLE MED: 62.5000 mL/h | INTRAVENOUS | Status: DC

## 2015-10-07 MED ORDER — DIPHENHYDRAMINE HCL 25 MG PO CAPS: 25.0000 mg | ORAL_CAPSULE | Freq: Four times a day (QID) | ORAL | Status: DC | PRN

## 2015-10-07 MED ORDER — OXYCODONE-ACETAMINOPHEN 5-325 MG PO TABS: 2.0000 | ORAL_TABLET | ORAL | Status: DC | PRN

## 2015-10-07 MED ORDER — LANOLIN HYDROUS EX OINT: TOPICAL_OINTMENT | CUTANEOUS | Status: DC | PRN

## 2015-10-07 MED ORDER — OXYCODONE-ACETAMINOPHEN 5-325 MG PO TABS
1.0000 | ORAL_TABLET | ORAL | Status: DC | PRN
  Filled 2015-10-07 (×2): qty 1

## 2015-10-07 MED ORDER — SENNOSIDES-DOCUSATE SODIUM 8.6-50 MG PO TABS
2.0000 | ORAL_TABLET | ORAL | Status: DC
  Administered 2015-10-07: 2 via ORAL
  Filled 2015-10-07: qty 2

## 2015-10-07 MED ORDER — OXYTOCIN 40 UNITS IN LACTATED RINGERS INFUSION - SIMPLE MED
1.0000 m[IU]/min | INTRAVENOUS | Status: DC
  Administered 2015-10-07: 2 m[IU]/min via INTRAVENOUS
  Filled 2015-10-07: qty 1000

## 2015-10-07 MED ORDER — OXYTOCIN BOLUS FROM INFUSION
500.0000 mL | INTRAVENOUS | Status: DC
  Administered 2015-10-07: 500 mL via INTRAVENOUS

## 2015-10-07 MED ORDER — CITRIC ACID-SODIUM CITRATE 334-500 MG/5ML PO SOLN: 30.0000 mL | ORAL | Status: DC | PRN

## 2015-10-07 MED ORDER — DIBUCAINE 1 % RE OINT
1.0000 "application " | TOPICAL_OINTMENT | RECTAL | Status: DC | PRN
  Filled 2015-10-07: qty 28

## 2015-10-07 MED ORDER — BENZOCAINE-MENTHOL 20-0.5 % EX AERO
1.0000 "application " | INHALATION_SPRAY | CUTANEOUS | Status: DC | PRN
  Filled 2015-10-07: qty 56

## 2015-10-07 MED ORDER — ZOLPIDEM TARTRATE 5 MG PO TABS: 5.0000 mg | ORAL_TABLET | Freq: Every evening | ORAL | Status: DC | PRN

## 2015-10-07 MED ORDER — IBUPROFEN 600 MG PO TABS
600.0000 mg | ORAL_TABLET | Freq: Four times a day (QID) | ORAL | Status: DC
Start: 2015-10-07 — End: 2015-10-08
  Administered 2015-10-07 – 2015-10-08 (×4): 600 mg via ORAL
  Filled 2015-10-07 (×4): qty 1

## 2015-10-07 MED ORDER — OXYTOCIN 40 UNITS IN LACTATED RINGERS INFUSION - SIMPLE MED: 1.0000 m[IU]/min | INTRAVENOUS | Status: DC

## 2015-10-07 MED ORDER — ONDANSETRON HCL 4 MG PO TABS: 4.0000 mg | ORAL_TABLET | ORAL | Status: DC | PRN

## 2015-10-07 NOTE — Progress Notes (Signed)
Precipitous delivery of vigorous infant by RN.  apgars 9,9 I arrived and baby stable on moms chest. I delivered placenta, no lacerations noted Fundus firm

## 2015-10-07 NOTE — Lactation Note (Signed)
This note was copied from the chart of Wendy Yasemin Scrivner. Lactation Consultation Note  Initial visit made.  Breastfeeding consultation services and support information given and reviewed.  This is mother's fourth child and she breastfed previous babies for one year.  Baby is 5 hours old and has been feeding well.  Mom states baby just finished a feeding.  Baby is sleeping in FOB'S arms.  Instructed to feed with any feeding cue.  Encouraged to call for assist prn.  Patient Name: Wendy Alexander AVWUJ'WToday's Date: 10/07/2015 Reason for consult: Initial assessment   Maternal Data Formula Feeding for Exclusion: No Does the patient have breastfeeding experience prior to this delivery?: Yes  Feeding Feeding Type: Breast Fed Length of feed: 20 min  LATCH Score/Interventions                      Lactation Tools Discussed/Used     Consult Status Consult Status: PRN Follow-up type: In-patient    Huston FoleyMOULDEN, Soua Lenk S 10/07/2015, 5:18 PM

## 2015-10-07 NOTE — Progress Notes (Signed)
UR chart review completed.  

## 2015-10-07 NOTE — Progress Notes (Signed)
Pt comfortable, not feeling ctx  FHT cat 1 toco occasional Cvx 3cm  A/P:  IOL Admit Pitocin PCN Exp mngt Epidural prn

## 2015-10-08 LAB — CBC
HCT: 32.2 % — ABNORMAL LOW (ref 36.0–46.0)
Hemoglobin: 10.4 g/dL — ABNORMAL LOW (ref 12.0–15.0)
MCH: 24.2 pg — AB (ref 26.0–34.0)
MCHC: 32.3 g/dL (ref 30.0–36.0)
MCV: 75.1 fL — ABNORMAL LOW (ref 78.0–100.0)
Platelets: 127 10*3/uL — ABNORMAL LOW (ref 150–400)
RBC: 4.29 MIL/uL (ref 3.87–5.11)
RDW: 15.1 % (ref 11.5–15.5)
WBC: 9.1 10*3/uL (ref 4.0–10.5)

## 2015-10-08 MED ORDER — IBUPROFEN 600 MG PO TABS: 600.0000 mg | ORAL_TABLET | Freq: Four times a day (QID) | ORAL | Status: DC

## 2015-10-08 MED ORDER — PRENATAL MULTIVITAMIN CH: 1.0000 | ORAL_TABLET | Freq: Every day | ORAL | Status: DC

## 2015-10-08 NOTE — Lactation Note (Addendum)
This note was copied from the chart of Wendy Ailyne Pro. Lactation Consultation Note  Patient Name: Wendy Alexander WUJWJ'XToday's Date: 10/08/2015   Interpreter present for visit. Mother states baby is breastfeeding well. She breast fed her 7220 month old daughter for 12 months, reports she made a large milk supply and denies any problems. Mother was shown hand expression and steady flow of colostrum noted. Offered and mother accepted a hand pump, if needed after discharge. Instructions on use, cleaning and milk storage. She stays at home and reports feeding her baby ad lib. Mom made aware of O/P services, breastfeeding support groups, community resources, and our phone # for post-discharge questions. Mother appears to understand some English and answers some questions spontaneously without the interpreter.    Maternal Data    Feeding Feeding Type: Breast Fed Length of feed: 30 min  LATCH Score/Interventions Latch: Grasps breast easily, tongue down, lips flanged, rhythmical sucking.  Audible Swallowing: A few with stimulation  Type of Nipple: Everted at rest and after stimulation  Comfort (Breast/Nipple): Soft / non-tender     Hold (Positioning): Assistance needed to correctly position infant at breast and maintain latch.  LATCH Score: 8  Lactation Tools Discussed/Used     Consult Status      Wendy Alexander, Wendy Alexander 10/08/2015, 11:01 AM

## 2015-10-08 NOTE — Discharge Summary (Signed)
Obstetric Discharge Summary Reason for Admission: induction of labor Prenatal Procedures: ultrasound Intrapartum Procedures: spontaneous vaginal delivery Postpartum Procedures: none Complications-Operative and Postpartum: none HEMOGLOBIN  Date Value Ref Range Status  10/08/2015 10.4* 12.0 - 15.0 g/dL Final   HCT  Date Value Ref Range Status  10/08/2015 32.2* 36.0 - 46.0 % Final    Physical Exam:  General: alert and cooperative Lochia: appropriate Uterine Fundus: firm Incision: perineum intact DVT Evaluation: No evidence of DVT seen on physical exam. Negative Homan's sign. No cords or calf tenderness. No significant calf/ankle edema.  Discharge Diagnoses: Term Pregnancy-delivered  Discharge Information: Date: 10/08/2015 Activity: pelvic rest Diet: routine Medications: PNV and Ibuprofen Condition: stable Instructions: refer to practice specific booklet Discharge to: home   Newborn Data: Live born female  Birth Weight: 6 lb 9.6 oz (2995 g) APGAR: 9, 9  Home with mother.  CURTIS,CAROL G 10/08/2015, 8:07 AM

## 2015-10-09 ENCOUNTER — Ambulatory Visit: Payer: Self-pay

## 2015-10-09 NOTE — Lactation Note (Signed)
This note was copied from the chart of Wendy Cristin Laurie. Lactation Consultation Note  Follow up visit made with interpreter present prior to discharge.  Mom currently has baby latched and feeding well.  Mom denies questions.  Patient Name: Wendy Alexander AOZHY'QToday's Date: 10/09/2015     Maternal Data    Feeding Feeding Type: Breast Fed Length of feed: 10 min  LATCH Score/Interventions Latch: Grasps breast easily, tongue down, lips flanged, rhythmical sucking.  Audible Swallowing: A few with stimulation  Type of Nipple: Everted at rest and after stimulation  Comfort (Breast/Nipple): Soft / non-tender     Hold (Positioning): No assistance needed to correctly position infant at breast.  LATCH Score: 9  Lactation Tools Discussed/Used     Consult Status      Huston FoleyMOULDEN, Shaylea Ucci S 10/09/2015, 11:13 AM

## 2017-05-21 ENCOUNTER — Encounter (HOSPITAL_COMMUNITY): Payer: Self-pay | Admitting: Nurse Practitioner

## 2017-05-21 ENCOUNTER — Ambulatory Visit (HOSPITAL_COMMUNITY)
Admission: EM | Admit: 2017-05-21 | Discharge: 2017-05-21 | Disposition: A | Discharge status: Home / Self Care | Payer: BLUE CROSS/BLUE SHIELD | Attending: Internal Medicine | Admitting: Internal Medicine

## 2017-05-21 DIAGNOSIS — L299 Pruritus, unspecified: Secondary | ICD-10-CM

## 2017-05-21 DIAGNOSIS — R21 Rash and other nonspecific skin eruption: Secondary | ICD-10-CM | POA: Diagnosis not present

## 2017-05-21 MED ORDER — METHYLPREDNISOLONE ACETATE 80 MG/ML IJ SUSP
80.0000 mg | Freq: Once | INTRAMUSCULAR | Status: AC
  Administered 2017-05-21: 80 mg via INTRAMUSCULAR

## 2017-05-21 MED ORDER — METHYLPREDNISOLONE ACETATE 80 MG/ML IJ SUSP
INTRAMUSCULAR | Status: AC
  Filled 2017-05-21: qty 1

## 2017-05-21 MED ORDER — HYDROXYZINE HCL 25 MG PO TABS: 25.0000 mg | ORAL_TABLET | Freq: Four times a day (QID) | ORAL | 0 refills | Status: DC

## 2017-05-21 NOTE — ED Provider Notes (Signed)
  [facility]   [id number] 05/21/17 Arrival Time: 1603  ASSESSMENT & PLAN:  1. Rash     Meds ordered this encounter  Medications  . hydrOXYzine (ATARAX/VISTARIL) 25 MG tablet    Sig: Take 1 tablet (25 mg total) by mouth every 6 (six) hours.    Dispense:  30 tablet    Refill:  0    Order Specific Question:   Supervising Provider    Answer:   Eustace MooreMURRAY, LAURA W [[id number]]  . methylPREDNISolone acetate (DEPO-MEDROL) injection 80 mg    Reviewed expectations re: course of current medical issues. Questions answered. Outlined signs and symptoms indicating need for more acute intervention. Patient verbalized understanding. After Visit Summary given.   SUBJECTIVE:  Wendy Alexander is a 31 y.o. female who presents with complaint of Rash and itching that has full body. Started this morning, she has not had a previous rash like this before. Has no known allergies, no new medicines, no new soaps, no new detergents or clothes, no new pets, no new food items. She has no wheezing or shortness of breath, no difficulty swallowing, no nausea or vomiting. She does not smoke or drink, denies recreational drug use.  ROS: As per HPI, remainder of ROS negative.   OBJECTIVE:  Vitals:   05/21/17 1722  BP: (!) 145/87  Pulse: 84  Resp: 16  Temp: 98 F (36.7 C)  TempSrc: Oral  SpO2: 100%     General appearance: alert; no distress HEENT: normocephalic; atraumatic; conjunctivae normal;  Lungs: clear to auscultation bilaterally Heart: regular rate and rhythm Abdomen: soft, non-tender;  Back: no CVA tenderness Extremities: no cyanosis or edema; symmetrical with no gross deformities Skin: warm and dry, hives noted on her arms, legs, back, chest, and abdomen.  Neurologic: Grossly normal Psychological:  alert and cooperative; normal mood and affect  Procedures:    Labs Reviewed - No data to display  No results found.  No Known Allergies  PMHx, SurgHx, SocialHx, Medications, and Allergies  were reviewed in the Visit Navigator and updated as appropriate.       Dorena BodoKennard, Berna Gitto, NP 05/21/17 [phone]

## 2017-05-21 NOTE — Discharge Instructions (Signed)
You have been given an injection of Depo-Medrol, and for itching been new prescription of Atarax. If you have any worsening of your symptoms or trouble breathing go to the ER.

## 2017-05-21 NOTE — ED Triage Notes (Signed)
Pt presents with rash. The rash began early this morning. The rash is red and itchy to her entire body. The rash is not painful. She denies using any new medications, foods or products. She applied some of her childs triamcinolone cream to the rash with no improvement.

## 2018-08-13 ENCOUNTER — Ambulatory Visit (HOSPITAL_COMMUNITY)
Admission: EM | Admit: 2018-08-13 | Discharge: 2018-08-13 | Disposition: A | Discharge status: Home / Self Care | Payer: BLUE CROSS/BLUE SHIELD | Attending: Family Medicine | Admitting: Family Medicine

## 2018-08-13 ENCOUNTER — Encounter (HOSPITAL_COMMUNITY): Payer: Self-pay

## 2018-08-13 DIAGNOSIS — M545 Low back pain, unspecified: Secondary | ICD-10-CM

## 2018-08-13 MED ORDER — MELOXICAM 7.5 MG PO TABS: 7.5000 mg | ORAL_TABLET | Freq: Every day | ORAL | 0 refills | Status: DC

## 2018-08-13 MED ORDER — KETOROLAC TROMETHAMINE 30 MG/ML IJ SOLN
30.0000 mg | Freq: Once | INTRAMUSCULAR | Status: AC
  Administered 2018-08-13: 30 mg via INTRAMUSCULAR

## 2018-08-13 MED ORDER — METHOCARBAMOL 500 MG PO TABS: 500.0000 mg | ORAL_TABLET | Freq: Two times a day (BID) | ORAL | 0 refills | Status: DC

## 2018-08-13 MED ORDER — KETOROLAC TROMETHAMINE 30 MG/ML IJ SOLN
INTRAMUSCULAR | Status: AC
  Filled 2018-08-13: qty 1

## 2018-08-13 NOTE — Discharge Instructions (Signed)
Start Mobic. Do not take ibuprofen (motrin/advil)/ naproxen (aleve) while on mobic. Robaxin as needed at night, this can make you drowsy, so do not take if you are going to drive, operate heavy machinery, or make important decisions. Ice/heat compresses as needed. This can take up to 3-4 weeks to completely resolve, but you should be feeling better each week. Follow up here or with PCP if symptoms worsen, changes for reevaluation. If experience numbness/tingling of the inner thighs, loss of bladder or bowel control, go to the emergency department for evaluation.

## 2018-08-13 NOTE — ED Triage Notes (Signed)
Pt presents with severe lower back pain. 

## 2018-08-13 NOTE — ED Provider Notes (Signed)
[facility]    CSN: [id number] Arrival date & time: 08/13/18  1528     History   Chief Complaint Chief Complaint  Patient presents with  . Back Pain    HPI Wendy Alexander is a 32 y.o. female.   32 year old female comes in for 2 to 3-day history of low back pain.  Denies injury/trauma.  Pain is to the middle right area of the lumbar region, pain is cramping in sensation, worse with movement.  Denies radiation of pain.  Has been trying heating pad, back brace without relief.  Denies urinary symptoms such as frequency, dysuria, hematuria.  Denies abdominal pain, nausea, vomiting.  Took Tylenol without relief.     Past Medical History:  Diagnosis Date  . Vaginal Pap smear, abnormal    last pap 03/21/13 WNL    Patient Active Problem List   Diagnosis Date Noted  . Normal pregnancy 10/07/2015  . SVD (spontaneous vaginal delivery) 10/07/2015  . Indication for care in labor or delivery 01/27/2014  . NSVD (normal spontaneous vaginal delivery) 01/27/2014    Past Surgical History:  Procedure Laterality Date  . NO PAST SURGERIES      OB History    Gravida  4   Para  4   Term  4   Preterm      AB      Living  4     SAB      TAB      Ectopic      Multiple  0   Live Births  4            Home Medications    Prior to Admission medications   Medication Sig Start Date End Date Taking? Authorizing Provider  hydrOXYzine (ATARAX/VISTARIL) 25 MG tablet Take 1 tablet (25 mg total) by mouth every 6 (six) hours. 05/21/17   Dorena Bodo, NP  ibuprofen (ADVIL,MOTRIN) 600 MG tablet Take 1 tablet (600 mg total) by mouth every 6 (six) hours. 10/08/15   Julio Sicks, NP  meloxicam (MOBIC) 7.5 MG tablet Take 1 tablet (7.5 mg total) by mouth daily. 08/13/18   Cathie Hoops, Amy V, PA-C  methocarbamol (ROBAXIN) 500 MG tablet Take 1 tablet (500 mg total) by mouth 2 (two) times daily. 08/13/18   Belinda Fisher, PA-C  Prenatal Vit-Fe Fumarate-FA (PRENATAL MULTIVITAMIN) TABS tablet  Take 1 tablet by mouth daily at 12 noon. 10/08/15   Julio Sicks, NP    Family History Family History  Problem Relation Age of Onset  . Cancer Brother     Social History Social History   Tobacco Use  . Smoking status: Never Smoker  . Smokeless tobacco: Never Used  Substance Use Topics  . Alcohol use: No  . Drug use: No     Allergies   Patient has no known allergies.   Review of Systems Review of Systems  Reason unable to perform ROS: See HPI as above.     Physical Exam Triage Vital Signs ED Triage Vitals [08/13/18 1609]  Enc Vitals Group     BP 117/78     Pulse Rate 84     Resp 20     Temp 97.9 F (36.6 C)     Temp Source Oral     SpO2 100 %     Weight      Height      Head Circumference      Peak Flow      Pain Score 10  Pain Loc      Pain Edu?      Excl. in GC?    No data found.  Updated Vital Signs BP 117/78 (BP Location: Right Arm)   Pulse 84   Temp 97.9 F (36.6 C) (Oral)   Resp 20   LMP  (LMP Unknown)   SpO2 100%   Physical Exam  Constitutional: She is oriented to person, place, and time. She appears well-developed and well-nourished. No distress.  HENT:  Head: Normocephalic and atraumatic.  Eyes: Pupils are equal, round, and reactive to light. Conjunctivae are normal.  Cardiovascular: Normal rate, regular rhythm and normal heart sounds. Exam reveals no gallop and no friction rub.  No murmur heard. Pulmonary/Chest: Effort normal and breath sounds normal. No accessory muscle usage or stridor. No respiratory distress. She has no decreased breath sounds. She has no wheezes. She has no rhonchi. She has no rales.  Musculoskeletal:  No tenderness on palpation of the spinous processes.  Tenderness to palpation along right paraspinal muscles.  Exam limited, patient bending over exam table due to pain, and having trouble doing range of motion.  Neurological: She is alert and oriented to person, place, and time.  Skin: Skin is warm and dry. She  is not diaphoretic.     UC Treatments / Results  Labs (all labs ordered are listed, but only abnormal results are displayed) Labs Reviewed - No data to display  EKG None  Radiology No results found.  Procedures Procedures (including critical care time)  Medications Ordered in UC Medications  ketorolac (TORADOL) 30 MG/ML injection 30 mg (30 mg Intramuscular Given 08/13/18 1647)    Initial Impression / Assessment and Plan / UC Course  I have reviewed the triage vital signs and the nursing notes.  Pertinent labs & imaging results that were available during my care of the patient were reviewed by me and considered in my medical decision making (see chart for details).    Will provide Toradol injection in office, and reassess.  Patient with much improved symptoms after Toradol injection.  Able to tolerate exam.  Full range of motion of back and hips.  Sensation intact and equal bilaterally.  Negative straight leg raise.  Will provide Mobic and muscle relaxant at home.  Return precautions given.  Patient expresses understanding and agrees to plan.  Final Clinical Impressions(s) / UC Diagnoses   Final diagnoses:  Acute right-sided low back pain without sciatica    ED Prescriptions    Medication Sig Dispense Auth. Provider   meloxicam (MOBIC) 7.5 MG tablet Take 1 tablet (7.5 mg total) by mouth daily. 15 tablet Yu, Amy V, PA-C   methocarbamol (ROBAXIN) 500 MG tablet Take 1 tablet (500 mg total) by mouth 2 (two) times daily. 20 tablet Threasa Alpha, PA-C 08/13/18 1747

## 2020-07-12 ENCOUNTER — Other Ambulatory Visit: Payer: Self-pay

## 2020-07-12 ENCOUNTER — Inpatient Hospital Stay (HOSPITAL_COMMUNITY)
Admission: AD | Admit: 2020-07-12 | Discharge: 2020-07-12 | Disposition: A | Discharge status: Home / Self Care | Payer: BLUE CROSS/BLUE SHIELD | Attending: Obstetrics and Gynecology | Admitting: Obstetrics and Gynecology

## 2020-07-12 DIAGNOSIS — N926 Irregular menstruation, unspecified: Secondary | ICD-10-CM | POA: Insufficient documentation

## 2020-07-12 DIAGNOSIS — Z711 Person with feared health complaint in whom no diagnosis is made: Secondary | ICD-10-CM

## 2020-07-12 DIAGNOSIS — Z3200 Encounter for pregnancy test, result unknown: Secondary | ICD-10-CM

## 2020-07-12 NOTE — MAU Provider Note (Signed)
Chief Complaint: Possible Pregnancy   None     SUBJECTIVE HPI: Wendy Alexander is a 34 y.o. [id number] who presents to maternity admissions for pregnancy test only. She has no pain or bleeding. No complaints.   Past Medical History:  Diagnosis Date  . Vaginal Pap smear, abnormal    last pap 03/21/13 WNL   Past Surgical History:  Procedure Laterality Date  . NO PAST SURGERIES     Social History   Socioeconomic History  . Marital status: Single    Spouse name: Not on file  . Number of children: Not on file  . Years of education: Not on file  . Highest education level: Not on file  Occupational History  . Not on file  Tobacco Use  . Smoking status: Never Smoker  . Smokeless tobacco: Never Used  Substance and Sexual Activity  . Alcohol use: No  . Drug use: No  . Sexual activity: Not on file  Other Topics Concern  . Not on file  Social History Narrative  . Not on file   Social Determinants of Health   Financial Resource Strain:   . Difficulty of Paying Living Expenses: Not on file  Food Insecurity:   . Worried About Programme researcher, broadcasting/film/video in the Last Year: Not on file  . Ran Out of Food in the Last Year: Not on file  Transportation Needs:   . Lack of Transportation (Medical): Not on file  . Lack of Transportation (Non-Medical): Not on file  Physical Activity:   . Days of Exercise per Week: Not on file  . Minutes of Exercise per Session: Not on file  Stress:   . Feeling of Stress : Not on file  Social Connections:   . Frequency of Communication with Friends and Family: Not on file  . Frequency of Social Gatherings with Friends and Family: Not on file  . Attends Religious Services: Not on file  . Active Member of Clubs or Organizations: Not on file  . Attends Banker Meetings: Not on file  . Marital Status: Not on file  Intimate Partner Violence:   . Fear of Current or Ex-Partner: Not on file  . Emotionally Abused: Not on file  . Physically Abused: Not on  file  . Sexually Abused: Not on file   No current facility-administered medications on file prior to encounter.   Current Outpatient Medications on File Prior to Encounter  Medication Sig Dispense Refill  . hydrOXYzine (ATARAX/VISTARIL) 25 MG tablet Take 1 tablet (25 mg total) by mouth every 6 (six) hours. 30 tablet 0  . ibuprofen (ADVIL,MOTRIN) 600 MG tablet Take 1 tablet (600 mg total) by mouth every 6 (six) hours. 30 tablet 1  . meloxicam (MOBIC) 7.5 MG tablet Take 1 tablet (7.5 mg total) by mouth daily. 15 tablet 0  . methocarbamol (ROBAXIN) 500 MG tablet Take 1 tablet (500 mg total) by mouth 2 (two) times daily. 20 tablet 0  . Prenatal Vit-Fe Fumarate-FA (PRENATAL MULTIVITAMIN) TABS tablet Take 1 tablet by mouth daily at 12 noon. 30 tablet 6   No Known Allergies  ROS:  Review of Systems  Gastrointestinal: Negative for nausea and vomiting.  Genitourinary: Negative for vaginal bleeding and vaginal discharge.    I have reviewed patient's Past Medical Hx, Surgical Hx, Family Hx, Social Hx, medications and allergies.   Physical Exam   Patient Vitals for the past 24 hrs:  BP Temp Temp src Pulse Resp SpO2 Weight  07/12/20 1148 113/70  97.8 F (36.6 C) Oral 78 20 100 % 77.7 kg   Physical Exam Constitutional:      General: She is not in acute distress.    Appearance: Normal appearance. She is not ill-appearing, toxic-appearing or diaphoretic.  Neurological:     Mental Status: She is alert and oriented to person, place, and time.    MDM Patient denies any concerning symptoms in need of emergent evaluation. Patient advised that she may wait for a room in MAU or may choose to be discharged to seek non-emergent evaluation of her complaint at [facility], Urgent care or her PCP.   ASSESSMENT MSE Complete Missed period Non- emergent complaint   PLAN Discharge patient at her request to seek non-emergent medical care elsewhere Information for her to go to office for walk in pregnancy  test.   Duane Lope, NP 07/12/2020 11:55 AM

## 2020-07-12 NOTE — MAU Note (Signed)
Presents for pregnancy test.  Denies c/o pain.

## 2020-10-05 ENCOUNTER — Other Ambulatory Visit: Payer: Self-pay

## 2020-10-05 ENCOUNTER — Telehealth: Payer: Self-pay | Admitting: Obstetrics and Gynecology

## 2020-10-05 NOTE — Telephone Encounter (Signed)
TRIED TO REACH PATIENT REGARDING REFERRAL SHE DID NOT ANSWER. PATIENT HAS BEEN CALLED SEVERAL TIMES. I DID CALL [facility] TO LET THEM KNOW THAT SHE WAS UNREACHABLE.

## 2020-10-16 NOTE — L&D Delivery Note (Signed)
Delivery Note  At bedside for precipitous labor and delivery. Patient has a history of multiple precipitous labors, including out of hospital. Patient reports labor began 20 minutes prior to arrival, and was found to be 9 cm with bulging bag upon arrival.  Patient moved to labor and delivery while I was en route from office.  When patient was moved from triage bed, she delivered with minimal pushing in the bed. I was present for the immediate postpartum state and delivered the placenta.  Fundus firm, 1st degree laceration repaired with vicryl for hemostasis.  IM pit given after placental delivery due to lack of IV access.  At 11:51 AM a viable female was delivered via Vaginal, Spontaneous (Presentation: Left Occiput Anterior).  APGAR: 9, 9; weight  .   Placenta status: Manual removal, Intact.  Cord: 3 vessels with the following complications: Short.  Anesthesia: Local Episiotomy: None Lacerations: 1st degree Suture Repair: 2.0 Est. Blood Loss (mL): 150  Mom to postpartum.  Baby to Couplet care / Skin to Skin.  Wendy Alexander 12/31/2020, 12:13 PM

## 2020-12-15 LAB — OB RESULTS CONSOLE GBS: GBS: POSITIVE

## 2020-12-31 ENCOUNTER — Inpatient Hospital Stay (HOSPITAL_COMMUNITY)
Admission: AD | Admit: 2020-12-31 | Discharge: 2021-01-02 | DRG: 798 | Disposition: A | Discharge status: Home / Self Care | Payer: Medicaid Other | Attending: Obstetrics and Gynecology | Admitting: Obstetrics and Gynecology

## 2020-12-31 ENCOUNTER — Encounter (HOSPITAL_COMMUNITY): Payer: Self-pay | Admitting: *Deleted

## 2020-12-31 DIAGNOSIS — O99824 Streptococcus B carrier state complicating childbirth: Secondary | ICD-10-CM | POA: Diagnosis not present

## 2020-12-31 DIAGNOSIS — O26893 Other specified pregnancy related conditions, third trimester: Secondary | ICD-10-CM | POA: Diagnosis not present

## 2020-12-31 DIAGNOSIS — Z20822 Contact with and (suspected) exposure to covid-19: Secondary | ICD-10-CM | POA: Diagnosis not present

## 2020-12-31 DIAGNOSIS — Z3A38 38 weeks gestation of pregnancy: Secondary | ICD-10-CM

## 2020-12-31 DIAGNOSIS — Z3490 Encounter for supervision of normal pregnancy, unspecified, unspecified trimester: Secondary | ICD-10-CM

## 2020-12-31 LAB — CBC
HCT: 35.9 % — ABNORMAL LOW (ref 36.0–46.0)
Hemoglobin: 11.6 g/dL — ABNORMAL LOW (ref 12.0–15.0)
MCH: 25.2 pg — ABNORMAL LOW (ref 26.0–34.0)
MCHC: 32.3 g/dL (ref 30.0–36.0)
MCV: 78 fL — ABNORMAL LOW (ref 80.0–100.0)
Platelets: 157 10*3/uL (ref 150–400)
RBC: 4.6 MIL/uL (ref 3.87–5.11)
RDW: 14 % (ref 11.5–15.5)
WBC: 11.1 10*3/uL — ABNORMAL HIGH (ref 4.0–10.5)
nRBC: 0 % (ref 0.0–0.2)

## 2020-12-31 LAB — TYPE AND SCREEN
ABO/RH(D): A POS
Antibody Screen: NEGATIVE

## 2020-12-31 LAB — RESP PANEL BY RT-PCR (FLU A&B, COVID) ARPGX2
Influenza A by PCR: NEGATIVE
Influenza B by PCR: NEGATIVE
SARS Coronavirus 2 by RT PCR: NEGATIVE

## 2020-12-31 MED ORDER — OXYTOCIN 10 UNIT/ML IJ SOLN: 10.0000 [IU] | Freq: Once | INTRAMUSCULAR | Status: AC

## 2020-12-31 MED ORDER — ONDANSETRON HCL 4 MG/2ML IJ SOLN: 4.0000 mg | INTRAMUSCULAR | Status: DC | PRN

## 2020-12-31 MED ORDER — ZOLPIDEM TARTRATE 5 MG PO TABS: 5.0000 mg | ORAL_TABLET | Freq: Every evening | ORAL | Status: DC | PRN

## 2020-12-31 MED ORDER — BENZOCAINE-MENTHOL 20-0.5 % EX AERO: 1.0000 "application " | INHALATION_SPRAY | CUTANEOUS | Status: DC | PRN

## 2020-12-31 MED ORDER — IBUPROFEN 600 MG PO TABS
600.0000 mg | ORAL_TABLET | Freq: Four times a day (QID) | ORAL | Status: DC
  Administered 2020-12-31 – 2021-01-02 (×9): 600 mg via ORAL
  Filled 2020-12-31 (×9): qty 1

## 2020-12-31 MED ORDER — COCONUT OIL OIL: 1.0000 "application " | TOPICAL_OIL | Status: DC | PRN

## 2020-12-31 MED ORDER — LIDOCAINE HCL (PF) 1 % IJ SOLN
INTRAMUSCULAR | Status: AC
  Administered 2020-12-31: 30 mL
  Filled 2020-12-31: qty 30

## 2020-12-31 MED ORDER — PRENATAL MULTIVITAMIN CH
1.0000 | ORAL_TABLET | Freq: Every day | ORAL | Status: DC
  Administered 2021-01-01 – 2021-01-02 (×2): 1 via ORAL
  Filled 2020-12-31 (×2): qty 1

## 2020-12-31 MED ORDER — DIBUCAINE (PERIANAL) 1 % EX OINT: 1.0000 "application " | TOPICAL_OINTMENT | CUTANEOUS | Status: DC | PRN

## 2020-12-31 MED ORDER — TETANUS-DIPHTH-ACELL PERTUSSIS 5-2.5-18.5 LF-MCG/0.5 IM SUSY: 0.5000 mL | PREFILLED_SYRINGE | Freq: Once | INTRAMUSCULAR | Status: DC

## 2020-12-31 MED ORDER — ACETAMINOPHEN 325 MG PO TABS
650.0000 mg | ORAL_TABLET | ORAL | Status: DC | PRN
  Administered 2020-12-31: 650 mg via ORAL
  Filled 2020-12-31: qty 2

## 2020-12-31 MED ORDER — ONDANSETRON HCL 4 MG PO TABS: 4.0000 mg | ORAL_TABLET | ORAL | Status: DC | PRN

## 2020-12-31 MED ORDER — DIPHENHYDRAMINE HCL 25 MG PO CAPS: 25.0000 mg | ORAL_CAPSULE | Freq: Four times a day (QID) | ORAL | Status: DC | PRN

## 2020-12-31 MED ORDER — SIMETHICONE 80 MG PO CHEW
80.0000 mg | CHEWABLE_TABLET | ORAL | Status: DC | PRN
Start: 2020-12-31 — End: 2021-01-02

## 2020-12-31 MED ORDER — OXYTOCIN 10 UNIT/ML IJ SOLN
INTRAMUSCULAR | Status: AC
  Administered 2020-12-31: 10 [IU] via INTRAMUSCULAR
  Filled 2020-12-31: qty 1

## 2020-12-31 MED ORDER — WITCH HAZEL-GLYCERIN EX PADS: 1.0000 "application " | MEDICATED_PAD | CUTANEOUS | Status: DC | PRN

## 2020-12-31 MED ORDER — SENNOSIDES-DOCUSATE SODIUM 8.6-50 MG PO TABS
2.0000 | ORAL_TABLET | ORAL | Status: DC
  Administered 2020-12-31 – 2021-01-01 (×2): 2 via ORAL
  Filled 2020-12-31 (×2): qty 2

## 2020-12-31 MED ORDER — LIDOCAINE HCL (PF) 1 % IJ SOLN: 30.0000 mL | Freq: Once | INTRAMUSCULAR | Status: DC

## 2020-12-31 NOTE — Lactation Note (Addendum)
This note was copied from a baby's chart. Lactation Consultation Note  Patient Name: Wendy Alexander RWERX'V Date: 12/31/2020 Reason for consult: Initial assessment;Mother's request;Early term 37-38.6wks;Infant < 6lbs Age: 37 hrs  LC communicated with family with assistance of translators in [place] #[id number], Leonard Downing [phone]  Mom just fed infant Similac with Iron 20 cal/oz 11 ml 2 hrs prior to visit.  LC unable to see a latch at this time. Mom aware to call RN or LC for assistance with latching.   Infant sleeping in Mom's arms. Mom stated some pain with latching. Mom's nipples are erect but no compressions stripes noted or signs of abrasions.   LC reviewed with Mom how to get a deeper latch with different positions with cheeks and nose touching the breasts using pictures from breastfeeding brochure.  Mom set up on DEBP.  Plan 1. To feed based on cues 8-12x in 24 hour period, no more than 3 hrs without an attempt. Mom to offer both breasts, STS and look for swallows with feeding.         2. Dad to paced bottle feed with Similac with Iron 20 cal/oz up to 10 ml using extra slow flow nipple. LC reviewed with family members LPTI guidelines to prevent calorie loss and keep total feeding under 30 minutes.        3. Mom to pump using DEBP q 3hrs for 15 minutes with 27 flange.        4 I and O sheet reviewed.        5 LC brochure of inpatient and outpatient services reviewed.  All questions answered at the end of the visit.     Maternal Data Has patient been taught Hand Expression?: Yes Does the patient have breastfeeding experience prior to this delivery?: Yes How long did the patient breastfeed?: two children over 1 year  Feeding Mother's Current Feeding Choice: Breast Milk and Formula  LATCH Score Latch: Too sleepy or reluctant, no latch achieved, no sucking elicited.  Audible Swallowing: None  Type of Nipple: Everted at rest and after stimulation  Comfort (Breast/Nipple):  Soft / non-tender  Hold (Positioning): Full assist, staff holds infant at breast  LATCH Score: 4   Lactation Tools Discussed/Used    Interventions Interventions: Breast feeding basics reviewed;Breast compression;Skin to skin;DEBP;Breast massage;Position options;Education;Hand express;Expressed milk  Discharge Pump: Manual WIC Program: No  Consult Status Consult Status: Follow-up Date: 01/01/21 Follow-up type: In-patient    Wendy Whitsell  Alexander 12/31/2020, 8:54 PM

## 2020-12-31 NOTE — Lactation Note (Signed)
This note was copied from a baby's chart. Lactation Consultation Note  Patient Name: Wendy Alexander DHWYS'H Date: 12/31/2020   Age:37 hours  Per A. Pugh, RN (L&D nurse), patient prefers to wait and see lactation once she has been moved to her postpartum room.    Wendy Alexander [facility] 12/31/2020, 12:54 PM

## 2020-12-31 NOTE — H&P (Signed)
OB History and Physical   Wendy Alexander is a 35 y.o. female [phone] presenting for advanced labor and precipitous delivery at [redacted]w[redacted]d.  Patient has a history of multiple precipitous labors. She presented with 20 minutes of strong contractions, was found to be 9 cm with bulging bag and was moved to labor and delivery.  GBS positive. Rh positive. She failed 1 hr glucola but passed 3 hr.    OB History    Gravida  5   Para  5   Term  5   Preterm      AB      Living  5     SAB      IAB      Ectopic      Multiple  0   Live Births  5          Past Medical History:  Diagnosis Date   Vaginal Pap smear, abnormal    last pap 03/21/13 WNL   Past Surgical History:  Procedure Laterality Date   NO PAST SURGERIES     Family History: family history includes Cancer in her brother. Social History:  reports that she has never smoked. She has never used smokeless tobacco. She reports that she does not drink alcohol and does not use drugs.     Maternal Diabetes: failed 1 hr, passed 3 hr glucola Genetic Screening: Normal Maternal Ultrasounds/Referrals: Normal Fetal Ultrasounds or other Referrals:  None Maternal Substance Abuse:  No Significant Maternal Medications:  None Significant Maternal Lab Results:  Group B Strep positive Other Comments:  None  Review of Systems History Dilation: 9 Effacement (%): 100 Station: -1 Exam by:: jolynn Blood pressure 122/71, pulse 66, last menstrual period 04/07/2020, unknown if currently breastfeeding. Exam Physical Exam  Gen: alert, well appearing Chest: nonlabored breathing Abdomen: soft, nontender.  Immediately postpartum Ext: no signs of dvt  Prenatal labs: ABO, Rh:   Antibody:   Rubella:   RPR:    HBsAg:    HIV:    GBS: Positive/-- (03/02 0000)   Assessment/Plan:  Patient was admitted to labor and delivery and had immediate precipitous labor. Please see my delivery note.   Transfer postpartum per protocol.  Admission  labs and IV access to be obtained. Patient received IM pitocin  Nursery made aware of GBS positive status  Lyn Henri 12/31/2020, 1:28 PM

## 2021-01-01 LAB — CBC
HCT: 31.7 % — ABNORMAL LOW (ref 36.0–46.0)
Hemoglobin: 10.6 g/dL — ABNORMAL LOW (ref 12.0–15.0)
MCH: 25.5 pg — ABNORMAL LOW (ref 26.0–34.0)
MCHC: 33.4 g/dL (ref 30.0–36.0)
MCV: 76.4 fL — ABNORMAL LOW (ref 80.0–100.0)
Platelets: 144 10*3/uL — ABNORMAL LOW (ref 150–400)
RBC: 4.15 MIL/uL (ref 3.87–5.11)
RDW: 14.1 % (ref 11.5–15.5)
WBC: 9.4 10*3/uL (ref 4.0–10.5)
nRBC: 0 % (ref 0.0–0.2)

## 2021-01-01 LAB — RPR: RPR Ser Ql: NONREACTIVE

## 2021-01-01 NOTE — Plan of Care (Signed)
  Problem: Clinical Measurements: Goal: Ability to maintain clinical measurements within normal limits will improve Outcome: Completed/Met Goal: Respiratory complications will improve Outcome: Completed/Met Goal: Cardiovascular complication will be avoided Outcome: Completed/Met   Problem: Activity: Goal: Risk for activity intolerance will decrease Outcome: Completed/Met   Problem: Elimination: Goal: Will not experience complications related to urinary retention Outcome: Completed/Met

## 2021-01-01 NOTE — Lactation Note (Signed)
This note was copied from a baby's chart. Lactation Consultation Note  Patient Name: Wendy Alexander [id number] Date: 01/01/2021 Reason for consult: Follow-up assessment;Mother's request;Difficult latch;Term;Infant weight loss;Hyperbilirubinemia Age:37 hours  LC with help of two translators in [place] talked with the family. [employer] #[id number] and FB#[phone]. Parents stated preferred language [place]. LC alerted clerk to make the necessary change and a new translator machine placed in the patients room.   Mom states she has no milk. She only pumped twice today. LC reviewed with Mom importance of pumping q 3hrs for 15 minutes to maintain milk supply given the infant also receiving formula.  Infant will latch but not initiate a suck for more than 6 minutes.  Mom has no signs of compression stripe or trauma.   LC encouraged Mom to offer both breasts first, Dad to pace bottle feed according to breastfeeding supplementation guideline and Mom to pump using DEBP.   Mom flanged size increased to 30 by RN Leavy Cella.   LC reviewed with RN, Leavy Cella supplementation volumes we are trying to reach today. Infant took 16 ml of Similac with Iron 20 cal/oz.   All questions answered at the end of the visit.   Maternal Data    Feeding Mother's Current Feeding Choice: Breast Milk and Formula  LATCH Score Latch: Repeated attempts needed to sustain latch, nipple held in mouth throughout feeding, stimulation needed to elicit sucking reflex.  Audible Swallowing: None  Type of Nipple: Everted at rest and after stimulation  Comfort (Breast/Nipple): Soft / non-tender  Hold (Positioning): No assistance needed to correctly position infant at breast.  LATCH Score: 7   Lactation Tools Discussed/Used Tools: Pump;Flanges Flange Size: 30 Breast pump type: Double-Electric Breast Pump  Interventions Interventions: Breast feeding basics reviewed;Breast compression;Assisted with latch;Adjust position;Skin to  skin;Support pillows;DEBP;Breast massage;Position options;Hand express;Education  Discharge Pump: Manual  Consult Status Consult Status: Follow-up Date: 01/02/21 Follow-up type: In-patient    [name]  [name] 01/01/2021, 3:45 PM

## 2021-01-01 NOTE — Progress Notes (Signed)
Postpartum Progress Note  Post Partum Day 1 s/p spontaneous vaginal delivery.  Patient reportswell-controlled pain, ambulating without difficulty, voiding spontaneously, tolerating PO.  Vaginal bleeding is appropriate.   Objective: Blood pressure 106/76, pulse 65, temperature 97.6 F (36.4 C), temperature source Oral, resp. rate 18, last menstrual period 04/07/2020, SpO2 100 %, unknown if currently breastfeeding.  Physical Exam:  General: alert and no distress Lochia: appropriate Uterine Fundus: firm DVT Evaluation: No evidence of DVT seen on physical exam.  Recent Labs    12/31/20 1325  HGB 11.6*  HCT 35.9*    Assessment/Plan: . Postpartum Day 1, s/p vaginal delivery. . Continue routine postpartum care . Lactation following . Anticipate discharge home tomorrow, baby will stay for GBS positive mother and precipitous delivery   LOS: 1 day   Lyn Henri 01/01/2021, 7:06 AM

## 2021-01-02 ENCOUNTER — Encounter (HOSPITAL_COMMUNITY): Payer: Self-pay | Admitting: Anesthesiology

## 2021-01-02 ENCOUNTER — Encounter (HOSPITAL_COMMUNITY)
Admission: AD | Disposition: A | Discharge status: Home / Self Care | Payer: Self-pay | Source: Home / Self Care | Attending: Obstetrics and Gynecology

## 2021-01-02 SURGERY — LIGATION, FALLOPIAN TUBE, POSTPARTUM
Anesthesia: Spinal | Laterality: Bilateral

## 2021-01-02 MED ORDER — LACTATED RINGERS IV SOLN: INTRAVENOUS | Status: DC

## 2021-01-02 MED ORDER — IBUPROFEN 600 MG PO TABS: 600.0000 mg | ORAL_TABLET | Freq: Four times a day (QID) | ORAL | 0 refills | Status: AC

## 2021-01-02 MED ORDER — METOCLOPRAMIDE HCL 10 MG PO TABS
10.0000 mg | ORAL_TABLET | Freq: Once | ORAL | Status: AC
  Administered 2021-01-02: 10 mg via ORAL
  Filled 2021-01-02: qty 1

## 2021-01-02 MED ORDER — FAMOTIDINE 20 MG PO TABS
40.0000 mg | ORAL_TABLET | Freq: Once | ORAL | Status: AC
  Administered 2021-01-02: 40 mg via ORAL
  Filled 2021-01-02: qty 2

## 2021-01-02 MED ORDER — ACETAMINOPHEN 325 MG PO TABS: 650.0000 mg | ORAL_TABLET | ORAL | 0 refills | Status: AC | PRN

## 2021-01-02 NOTE — Discharge Summary (Signed)
Obstetric Discharge Summary  Wendy Alexander is a 35 y.o. female that presented on 12/31/2020 for precipitous labor. She delivered a viable female infant on 12/31/20.  Her postpartum course was uncomplicated and on PPD#2, she reported well controlled pain, spontaneous voiding, ambulating without difficulty, and tolerating PO. She remained until PPD#2 for GBS positive status with inadequate antibiotic coverage due to fast labor. She was stable for discharge home on 01/02/21 with plans for in-office follow up.  Hemoglobin  Date Value Ref Range Status  01/01/2021 10.6 (L) 12.0 - 15.0 g/dL Final   HCT  Date Value Ref Range Status  01/01/2021 31.7 (L) 36.0 - 46.0 % Final    Physical Exam:  General: alert and no distress Lochia: appropriate Uterine Fundus: firm DVT Evaluation: No evidence of DVT seen on physical exam.  Discharge Diagnoses: Term Pregnancy-delivered  Discharge Information: Date: 01/02/2021 Activity: pelvic rest Diet: routine Medications: Ibuprofen Condition: stable Instructions: refer to practice specific booklet Discharge to: home  Follow-up Information    [place] Follow up.   Why: Please follow up for 6 week postpartum visit.  Contact information: [address] [place] [address] [phone]               Newborn Data: Live born female  Birth Weight: 5 lb 14.2 oz (2671 g) APGAR: 9, 9  Newborn Delivery   Birth date/time: 12/31/2020 11:51:00 Delivery type: Vaginal, Spontaneous      Home with mother.  Lyn Henri 01/02/2021, 9:38 PM

## 2021-01-02 NOTE — Progress Notes (Signed)
Postpartum Progress Note  Post Partum Day 2 s/p spontaneous vaginal delivery.  Patient reportswell-controlled pain, ambulating without difficulty, voiding spontaneously, tolerating PO.  Vaginal bleeding is appropriate.   Patient reports she desires postpartum tubal ligation prior to discharge. She was previously been counseled in the office and gave consent   Objective: Blood pressure 111/66, pulse 69, temperature 97.9 F (36.6 C), temperature source Oral, resp. rate 18, last menstrual period 04/07/2020, SpO2 100 %, unknown if currently breastfeeding.  Physical Exam:  General: alert and no distress Lochia: appropriate Uterine Fundus: firm DVT Evaluation: No evidence of DVT seen on physical exam.  Recent Labs    12/31/20 1325 01/01/21 0556  HGB 11.6* 10.6*  HCT 35.9* 31.7*    Assessment/Plan: . Postpartum Day 2, s/p vaginal delivery. . Continue routine postpartum care . Desires postpartum tubal.  Previously counseled and consented in the office.  Discussed procedure with interpreter via phone, including risks which include but are not limited to bleeding,infection, damage to nearby organs, need for blood transfusion which she will accept when medically indicated.  We also discussed risk of regret and patient understands sterilization should be treated as permanent.  She would like to proceed.  Will coordinate with OR.  . Lactation following . Anticipate discharge after recovery from PP tubal.   LOS: 2 days   Lyn Henri 01/02/2021, 7:22 AM

## 2021-01-02 NOTE — Social Work (Signed)
CSW attempted to meet with MOB. Staff present in room. CSW will follow-up.   Geroge Gilliam, MSW, LCSWA Clinical Social Work [facility] [phone] 

## 2021-01-02 NOTE — Progress Notes (Signed)
At bedside to discuss plan regarding sterilization.   Upon further review she has blue cross blue shield coverage for prenatal care, but utilizing medicaid for hospital stay. She has not completed any documentation for medicaid coverage for PP tubal and I advised against completing today.  Discussed with patient and FOB in detail. Plan for discharge and 6 week PP visit to discuss long term birth control options.  Nilda Simmer MD

## 2021-01-02 NOTE — Social Work (Signed)
CSW received consult due to score of 10 on Edinburgh Depression Screen.    CSW met with MOB to assess and offer support. CSW observed infant in bassinet and FOB on couch. CSW attempted to use [employer] data processor for [employer]. CSW informed by interpreter representative that there is no one available for the language at this time. CSW apologized to parents for the lack of interpreter service at this time and verified they is no additional languages to meet parents need.  CSW apologized to parents. FOB was understanding and requested CSW attempt to speak with MOB in [place]. FOB stated MOB can understand some [place]. CSW informed MOB of reason for visit and assessed how MOB is currently feeling. MOB reported she is doing okay. MOB denies any SI or HI. MOB and FOB reported they have everything needed for infant. FOB denies having any additional needs at this time.  CSW identifies no further need for intervention and no barriers to discharge at this time.  Darra Lis, [place] Work [employer] [phone]

## 2021-01-03 ENCOUNTER — Other Ambulatory Visit (HOSPITAL_COMMUNITY): Payer: Medicaid Other

## 2021-01-05 ENCOUNTER — Inpatient Hospital Stay (HOSPITAL_COMMUNITY)
Admission: AD | Admit: 2021-01-05 | Payer: Medicaid Other | Source: Home / Self Care | Admitting: Obstetrics and Gynecology

## 2021-01-05 ENCOUNTER — Inpatient Hospital Stay (HOSPITAL_COMMUNITY): Payer: Medicaid Other

## 2023-07-27 DIAGNOSIS — H5213 Myopia, bilateral: Secondary | ICD-10-CM | POA: Diagnosis not present

## 2024-01-28 ENCOUNTER — Encounter (HOSPITAL_COMMUNITY): Payer: Self-pay

## 2024-01-28 ENCOUNTER — Ambulatory Visit (HOSPITAL_COMMUNITY): Admission: EM | Admit: 2024-01-28 | Discharge: 2024-01-28 | Disposition: A | Discharge status: Home / Self Care

## 2024-01-28 DIAGNOSIS — R21 Rash and other nonspecific skin eruption: Secondary | ICD-10-CM | POA: Diagnosis not present

## 2024-01-28 MED ORDER — METHYLPREDNISOLONE SODIUM SUCC 125 MG IJ SOLR
80.0000 mg | Freq: Once | INTRAMUSCULAR | Status: AC
  Administered 2024-01-28: 80 mg via INTRAMUSCULAR

## 2024-01-28 MED ORDER — METHYLPREDNISOLONE SODIUM SUCC 125 MG IJ SOLR
INTRAMUSCULAR | Status: AC
  Filled 2024-01-28: qty 2

## 2024-01-28 MED ORDER — HYDROXYZINE HCL 25 MG PO TABS: 25.0000 mg | ORAL_TABLET | Freq: Four times a day (QID) | ORAL | 0 refills | Status: AC

## 2024-01-28 NOTE — ED Triage Notes (Signed)
 Patient here today with c/o rash all over body since last night. No known cause.

## 2024-01-28 NOTE — ED Provider Notes (Signed)
 [facility]    CSN: [id number] Arrival date & time: 01/28/24  1719      History   Chief Complaint Chief Complaint  Patient presents with   Rash    HPI Wendy Alexander is a 38 y.o. female.   Patient complains of a red raised rash to arms, abdomen, chest, upper legs that started today.  She reports similar rash several years ago that subsided with steroids and Atarax.  She has no known allergies, does report eating seafood yesterday.  Denies mouth or tongue irritation, swelling.  No trouble breathing, no trouble swallowing.  She has tried nothing for symptoms.    Past Medical History:  Diagnosis Date   Vaginal Pap smear, abnormal    last pap 03/21/13 WNL    Patient Active Problem List   Diagnosis Date Noted   Pregnancy 12/31/2020   Normal pregnancy 10/07/2015   SVD (spontaneous vaginal delivery) 10/07/2015   Indication for care in labor or delivery 01/27/2014   NSVD (normal spontaneous vaginal delivery) 01/27/2014    Past Surgical History:  Procedure Laterality Date   NO PAST SURGERIES      OB History     Gravida  5   Para  5   Term  5   Preterm      AB      Living  5      SAB      IAB      Ectopic      Multiple  0   Live Births  5            Home Medications    Prior to Admission medications   Medication Sig Start Date End Date Taking? Authorizing Provider  levonorgestrel (MIRENA, 52 MG,) 20 MCG/DAY IUD 1 each by Intrauterine route once. 03/01/21  Yes [provider]  acetaminophen (TYLENOL) 325 MG tablet Take 2 tablets (650 mg total) by mouth every 4 (four) hours as needed (for pain scale < 4). 01/02/21   Gretchen Leavell, MD  hydrOXYzine (ATARAX) 25 MG tablet Take 1 tablet (25 mg total) by mouth every 6 (six) hours. 01/28/24  Yes Ward, Amadeo Coke Z, PA-C  ibuprofen (ADVIL) 600 MG tablet Take 1 tablet (600 mg total) by mouth every 6 (six) hours. 01/02/21   Gretchen Leavell, MD    Family History Family History  Problem  Relation Age of Onset   Cancer Brother     Social History Social History   Tobacco Use   Smoking status: Never   Smokeless tobacco: Never  Substance Use Topics   Alcohol use: No   Drug use: No     Allergies   Patient has no known allergies.   Review of Systems Review of Systems  Constitutional:  Negative for chills and fever.  HENT:  Negative for ear pain and sore throat.   Eyes:  Negative for pain and visual disturbance.  Respiratory:  Negative for cough and shortness of breath.   Cardiovascular:  Negative for chest pain and palpitations.  Gastrointestinal:  Negative for abdominal pain and vomiting.  Genitourinary:  Negative for dysuria and hematuria.  Musculoskeletal:  Negative for arthralgias and back pain.  Skin:  Positive for rash. Negative for color change.  Neurological:  Negative for seizures and syncope.  All other systems reviewed and are negative.    Physical Exam Triage Vital Signs ED Triage Vitals  Encounter Vitals Group     BP 01/28/24 1733 136/84     Systolic  BP Percentile --      Diastolic BP Percentile --      Pulse Rate 01/28/24 1733 74     Resp 01/28/24 1733 16     Temp 01/28/24 1733 98.2 F (36.8 C)     Temp Source 01/28/24 1733 Oral     SpO2 01/28/24 1733 97 %     Weight 01/28/24 1734 186 lb (84.4 kg)     Height --      Head Circumference --      Peak Flow --      Pain Score 01/28/24 1733 0     Pain Loc --      Pain Education --      Exclude from Growth Chart --    No data found.  Updated Vital Signs BP 136/84 (BP Location: Right Arm)   Pulse 74   Temp 98.2 F (36.8 C) (Oral)   Resp 16   Wt 186 lb (84.4 kg)   LMP  (LMP Unknown)   SpO2 97%   Breastfeeding No   BMI 32.95 kg/m   Visual Acuity Right Eye Distance:   Left Eye Distance:   Bilateral Distance:    Right Eye Near:   Left Eye Near:    Bilateral Near:     Physical Exam Vitals and nursing note reviewed.  Constitutional:      General: She is not in acute  distress.    Appearance: She is well-developed.  HENT:     Head: Normocephalic and atraumatic.  Eyes:     Conjunctiva/sclera: Conjunctivae normal.  Cardiovascular:     Rate and Rhythm: Normal rate and regular rhythm.     Heart sounds: No murmur heard. Pulmonary:     Effort: Pulmonary effort is normal. No respiratory distress.     Breath sounds: Normal breath sounds.  Abdominal:     Palpations: Abdomen is soft.     Tenderness: There is no abdominal tenderness.  Musculoskeletal:        General: No swelling.     Cervical back: Neck supple.  Skin:    General: Skin is warm and dry.     Capillary Refill: Capillary refill takes less than 2 seconds.     Comments: Maculopapular rash to bilateral arms, abdomen, chest.  Neurological:     Mental Status: She is alert.  Psychiatric:        Mood and Affect: Mood normal.      UC Treatments / Results  Labs (all labs ordered are listed, but only abnormal results are displayed) Labs Reviewed - No data to display  EKG   Radiology No results found.  Procedures Procedures (including critical care time)  Medications Ordered in UC Medications  methylPREDNISolone sodium succinate (SOLU-MEDROL) 125 mg/2 mL injection 80 mg (80 mg Intramuscular Given 01/28/24 1812)    Initial Impression / Assessment and Plan / UC Course  I have reviewed the triage vital signs and the nursing notes.  Pertinent labs & imaging results that were available during my care of the patient were reviewed by me and considered in my medical decision making (see chart for details).     Solu-Medrol given in clinic today.  Prescription for Atarax prescribed.  No mouth, tongue, throat irritation/ swelling.  No trouble breathing or swallowing.  Return precautions discussed. Final Clinical Impressions(s) / UC Diagnoses   Final diagnoses:  Rash     Discharge Instructions      Take Atarax every 6 hours as needed for itching. Return  if no improvement or symptoms  become worse.   ED Prescriptions     Medication Sig Dispense Auth. Provider   hydrOXYzine (ATARAX) 25 MG tablet Take 1 tablet (25 mg total) by mouth every 6 (six) hours. 12 tablet Ward, Jatniel Verastegui Z, PA-C      PDMP not reviewed this encounter.   Ward, Char Common, PA-C 01/28/24 1820

## 2024-01-28 NOTE — Discharge Instructions (Addendum)
 Take Atarax every 6 hours as needed for itching. Return if no improvement or symptoms become worse.
